# Patient Record
Sex: Female | Born: 1935 | Race: White | Hispanic: No | Smoking: Never smoker
Health system: Southern US, Community
[De-identification: ages and names within clinical notes are randomized; demographics above are authoritative.]

## PROBLEM LIST (undated history)

## (undated) DIAGNOSIS — C4491 Basal cell carcinoma of skin, unspecified: Secondary | ICD-10-CM

## (undated) DIAGNOSIS — G629 Polyneuropathy, unspecified: Secondary | ICD-10-CM

## (undated) DIAGNOSIS — M159 Polyosteoarthritis, unspecified: Secondary | ICD-10-CM

## (undated) DIAGNOSIS — L570 Actinic keratosis: Secondary | ICD-10-CM

## (undated) DIAGNOSIS — C4492 Squamous cell carcinoma of skin, unspecified: Secondary | ICD-10-CM

## (undated) DIAGNOSIS — E785 Hyperlipidemia, unspecified: Secondary | ICD-10-CM

## (undated) HISTORY — DX: Basal cell carcinoma of skin, unspecified: C44.91

## (undated) HISTORY — DX: Polyneuropathy, unspecified: G62.9

## (undated) HISTORY — DX: Squamous cell carcinoma of skin, unspecified: C44.92

## (undated) HISTORY — DX: Hyperlipidemia, unspecified: E78.5

## (undated) HISTORY — PX: EXCISION / CURETTAGE BONE CYST PHALANGES OF FOOT: SUR479

## (undated) HISTORY — PX: MEDIAL PARTIAL KNEE REPLACEMENT: SHX5965

## (undated) HISTORY — DX: Actinic keratosis: L57.0

## (undated) HISTORY — DX: Polyosteoarthritis, unspecified: M15.9

---

## 1985-07-26 HISTORY — PX: LAPAROSCOPIC CHOLECYSTECTOMY: SUR755

## 2020-01-07 ENCOUNTER — Encounter: Payer: Self-pay | Admitting: Dermatology

## 2020-01-07 ENCOUNTER — Other Ambulatory Visit: Payer: Self-pay

## 2020-01-07 ENCOUNTER — Ambulatory Visit (INDEPENDENT_AMBULATORY_CARE_PROVIDER_SITE_OTHER): Payer: Medicare Other | Admitting: Dermatology

## 2020-01-07 DIAGNOSIS — L820 Inflamed seborrheic keratosis: Secondary | ICD-10-CM | POA: Diagnosis not present

## 2020-01-07 DIAGNOSIS — D229 Melanocytic nevi, unspecified: Secondary | ICD-10-CM | POA: Diagnosis not present

## 2020-01-07 DIAGNOSIS — Z86007 Personal history of in-situ neoplasm of skin: Secondary | ICD-10-CM

## 2020-01-07 DIAGNOSIS — L570 Actinic keratosis: Secondary | ICD-10-CM | POA: Diagnosis not present

## 2020-01-07 DIAGNOSIS — D1801 Hemangioma of skin and subcutaneous tissue: Secondary | ICD-10-CM | POA: Diagnosis not present

## 2020-01-07 DIAGNOSIS — Z1283 Encounter for screening for malignant neoplasm of skin: Secondary | ICD-10-CM

## 2020-01-07 DIAGNOSIS — L821 Other seborrheic keratosis: Secondary | ICD-10-CM

## 2020-01-07 DIAGNOSIS — Z85828 Personal history of other malignant neoplasm of skin: Secondary | ICD-10-CM

## 2020-01-07 DIAGNOSIS — L578 Other skin changes due to chronic exposure to nonionizing radiation: Secondary | ICD-10-CM

## 2020-01-07 DIAGNOSIS — I8310 Varicose veins of unspecified lower extremity with inflammation: Secondary | ICD-10-CM

## 2020-01-07 DIAGNOSIS — L814 Other melanin hyperpigmentation: Secondary | ICD-10-CM

## 2020-01-07 NOTE — Progress Notes (Signed)
Follow-Up Visit   Subjective  Lori Ferguson is a 86 y.o. female who presents for the following: Annual Exam (Hx of SCC and BCC ). The patient presents for Total-Body Skin Exam (TBSE) for skin cancer screening and mole check.  The following portions of the chart were reviewed this encounter and updated as appropriate:  Allergies  Meds  Problems  Med Hx  Surg Hx  Fam Hx     Review of Systems:  No other skin or systemic complaints except as noted in HPI or Assessment and Plan.  Objective  Well appearing patient in no apparent distress; mood and affect are within normal limits.  A full examination was performed including scalp, head, eyes, ears, nose, lips, neck, chest, axillae, abdomen, back, buttocks, bilateral upper extremities, bilateral lower extremities, hands, feet, fingers, toes, fingernails, and toenails. All findings within normal limits unless otherwise noted below.  Objective  B/L arms and legs: Erythematous keratotic or waxy stuck-on papule or plaque.   Objective  R ant nasal ala inf to nasal alar groove: Erythematous thin papules/macules with gritty scale.   Assessment & Plan    Inflamed seborrheic keratosis B/L arms and legs  Vs Disseminated Superficial Actinic Porokeratosis (DSAP) - Benign, observe.    AK (actinic keratosis) R ant nasal ala inf to nasal alar groove  Destruction of lesion - R ant nasal ala inf to nasal alar groove Complexity: simple   Destruction method: cryotherapy   Informed consent: discussed and consent obtained   Timeout:  patient name, date of birth, surgical site, and procedure verified Lesion destroyed using liquid nitrogen: Yes   Region frozen until ice ball extended beyond lesion: Yes   Outcome: patient tolerated procedure well with no complications   Post-procedure details: wound care instructions given    Skin cancer screening   Lentigines - Scattered tan macules - Discussed due to sun exposure - Benign, observe -  Call for any changes  Seborrheic Keratoses - Stuck-on, waxy, tan-brown papules and plaques  - Discussed benign etiology and prognosis. - Observe - Call for any changes  Melanocytic Nevi - Tan-brown and/or pink-flesh-colored symmetric macules and papules - Benign appearing on exam today - Observation - Call clinic for new or changing moles - Recommend daily use of broad spectrum spf 30+ sunscreen to sun-exposed areas.   Hemangiomas - Red papules - Discussed benign nature - Observe - Call for any changes  Actinic Damage - diffuse scaly erythematous macules with underlying dyspigmentation - Recommend daily broad spectrum sunscreen SPF 30+ to sun-exposed areas, reapply every 2 hours as needed.  - Call for new or changing lesions.  History of Basal Cell Carcinoma of the Skin - No evidence of recurrence today - Recommend regular full body skin exams - Recommend daily broad spectrum sunscreen SPF 30+ to sun-exposed areas, reapply every 2 hours as needed.  - Call if any new or changing lesions are noted between office visits  History of Squamous Cell Carcinoma in Situ of the Skin - No evidence of recurrence today - Recommend regular full body skin exams - Recommend daily broad spectrum sunscreen SPF 30+ to sun-exposed areas, reapply every 2 hours as needed.  - Call if any new or changing lesions are noted between office visits  Varicose Veins and Spider Veins - Dilated blue, purple or red veins at the lower extremities - Reassured - These can be treated by sclerotherapy (a procedure to inject a medicine into the veins to make them disappear) if desired, but  the treatment is not covered by insurance   Skin cancer screening performed today.   Return in about 1 year (around 01/06/2021) for TBSE; 3 months for AK follow up .  Luther Redo, CMA, am acting as scribe for Sarina Ser, MD .  Documentation: I have reviewed the above documentation for accuracy and completeness,  and I agree with the above.  Sarina Ser, MD

## 2020-01-09 ENCOUNTER — Encounter: Payer: Self-pay | Admitting: Dermatology

## 2020-03-24 ENCOUNTER — Emergency Department: Payer: Medicare Other

## 2020-03-24 ENCOUNTER — Encounter: Payer: Self-pay | Admitting: Family Medicine

## 2020-03-24 ENCOUNTER — Encounter: Payer: Self-pay | Admitting: Emergency Medicine

## 2020-03-24 ENCOUNTER — Other Ambulatory Visit: Payer: Self-pay

## 2020-03-24 ENCOUNTER — Emergency Department
Admission: EM | Admit: 2020-03-24 | Discharge: 2020-03-24 | Disposition: A | Discharge status: Home / Self Care | Payer: Medicare Other | Attending: Emergency Medicine | Admitting: Emergency Medicine

## 2020-03-24 ENCOUNTER — Ambulatory Visit
Admission: EM | Admit: 2020-03-24 | Discharge: 2020-03-24 | Disposition: A | Discharge status: Home / Self Care | Payer: Medicare Other

## 2020-03-24 DIAGNOSIS — Y999 Unspecified external cause status: Secondary | ICD-10-CM | POA: Diagnosis not present

## 2020-03-24 DIAGNOSIS — R55 Syncope and collapse: Secondary | ICD-10-CM | POA: Diagnosis not present

## 2020-03-24 DIAGNOSIS — Z85828 Personal history of other malignant neoplasm of skin: Secondary | ICD-10-CM | POA: Insufficient documentation

## 2020-03-24 DIAGNOSIS — T7840XA Allergy, unspecified, initial encounter: Secondary | ICD-10-CM | POA: Diagnosis present

## 2020-03-24 DIAGNOSIS — X58XXXA Exposure to other specified factors, initial encounter: Secondary | ICD-10-CM | POA: Insufficient documentation

## 2020-03-24 DIAGNOSIS — I959 Hypotension, unspecified: Secondary | ICD-10-CM

## 2020-03-24 DIAGNOSIS — R0682 Tachypnea, not elsewhere classified: Secondary | ICD-10-CM

## 2020-03-24 DIAGNOSIS — R42 Dizziness and giddiness: Secondary | ICD-10-CM

## 2020-03-24 DIAGNOSIS — Y929 Unspecified place or not applicable: Secondary | ICD-10-CM | POA: Insufficient documentation

## 2020-03-24 DIAGNOSIS — L509 Urticaria, unspecified: Secondary | ICD-10-CM

## 2020-03-24 DIAGNOSIS — L500 Allergic urticaria: Secondary | ICD-10-CM | POA: Diagnosis not present

## 2020-03-24 DIAGNOSIS — Y939 Activity, unspecified: Secondary | ICD-10-CM | POA: Diagnosis not present

## 2020-03-24 LAB — CBC
HCT: 51.3 % — ABNORMAL HIGH (ref 36.0–46.0)
Hemoglobin: 17 g/dL — ABNORMAL HIGH (ref 12.0–15.0)
MCH: 31.4 pg (ref 26.0–34.0)
MCHC: 33.1 g/dL (ref 30.0–36.0)
MCV: 94.8 fL (ref 80.0–100.0)
Platelets: 302 10*3/uL (ref 150–400)
RBC: 5.41 MIL/uL — ABNORMAL HIGH (ref 3.87–5.11)
RDW: 13 % (ref 11.5–15.5)
WBC: 12.2 10*3/uL — ABNORMAL HIGH (ref 4.0–10.5)
nRBC: 0 % (ref 0.0–0.2)

## 2020-03-24 LAB — COMPREHENSIVE METABOLIC PANEL
ALT: 19 U/L (ref 0–44)
AST: 29 U/L (ref 15–41)
Albumin: 3.8 g/dL (ref 3.5–5.0)
Alkaline Phosphatase: 50 U/L (ref 38–126)
Anion gap: 10 (ref 5–15)
BUN: 22 mg/dL (ref 8–23)
CO2: 28 mmol/L (ref 22–32)
Calcium: 8.7 mg/dL — ABNORMAL LOW (ref 8.9–10.3)
Chloride: 101 mmol/L (ref 98–111)
Creatinine, Ser: 0.73 mg/dL (ref 0.44–1.00)
GFR calc Af Amer: >60 mL/min (ref >60)
GFR calc non Af Amer: >60 mL/min (ref >60)
Glucose, Bld: 136 mg/dL — ABNORMAL HIGH (ref 70–99)
Potassium: 4 mmol/L (ref 3.5–5.1)
Sodium: 139 mmol/L (ref 135–145)
Total Bilirubin: 1.1 mg/dL (ref 0.3–1.2)
Total Protein: 7.1 g/dL (ref 6.5–8.1)

## 2020-03-24 LAB — URINALYSIS, COMPLETE (UACMP) WITH MICROSCOPIC
Bacteria, UA: NONE SEEN
Bilirubin Urine: NEGATIVE
Glucose, UA: NEGATIVE mg/dL
Hgb urine dipstick: NEGATIVE
Ketones, ur: NEGATIVE mg/dL
Leukocytes,Ua: NEGATIVE
Nitrite: NEGATIVE
Protein, ur: 30 mg/dL — AB
Specific Gravity, Urine: 1.027 (ref 1.005–1.030)
pH: 5 (ref 5.0–8.0)

## 2020-03-24 LAB — TROPONIN I (HIGH SENSITIVITY): Troponin I (High Sensitivity): 6 ng/L (ref <18)

## 2020-03-24 MED ORDER — FAMOTIDINE IN NACL 20-0.9 MG/50ML-% IV SOLN
20.0000 mg | Freq: Once | INTRAVENOUS | Status: AC
  Administered 2020-03-24: 20 mg via INTRAVENOUS
  Filled 2020-03-24: qty 50

## 2020-03-24 MED ORDER — EPINEPHRINE 0.3 MG/0.3ML IJ SOAJ: 0.3000 mg | INTRAMUSCULAR | 0 refills | Status: DC | PRN

## 2020-03-24 MED ORDER — DIPHENHYDRAMINE HCL 50 MG/ML IJ SOLN
25.0000 mg | Freq: Once | INTRAMUSCULAR | Status: AC
  Administered 2020-03-24: 25 mg via INTRAVENOUS
  Filled 2020-03-24: qty 1

## 2020-03-24 MED ORDER — DIPHENHYDRAMINE HCL 50 MG/ML IJ SOLN
50.0000 mg | Freq: Once | INTRAMUSCULAR | Status: AC
  Administered 2020-03-24: 50 mg via INTRAVENOUS
  Filled 2020-03-24: qty 1

## 2020-03-24 MED ORDER — PREDNISONE 20 MG PO TABS: 60.0000 mg | ORAL_TABLET | Freq: Every day | ORAL | 0 refills | Status: AC

## 2020-03-24 MED ORDER — METHYLPREDNISOLONE SODIUM SUCC 125 MG IJ SOLR
125.0000 mg | Freq: Once | INTRAMUSCULAR | Status: AC
  Administered 2020-03-24: 125 mg via INTRAVENOUS
  Filled 2020-03-24: qty 2

## 2020-03-24 NOTE — ED Notes (Signed)
Pt presents with rash since Saturday evening, Beginning around her abdomen and spreading to her arms, chest, and back. Pt reports no rash to legs. Pt states rash has still been spreading, moving up to her chin since she arrived in the ED. Pt denies changes to work of breathing. Pt not on o2 at home  Pt reports one syncopal episode this morning at approx 0700, from which she "fell on her back," denies pain to head, neck, arms, hips, knees, etc.   Pt began new course of antibiotics on Friday (x2). Denies n/v/d

## 2020-03-24 NOTE — ED Triage Notes (Signed)
Pt here for syncope this AM.  Also here for rash to BUE and torso.  LUE is swollen more so than right and appears one large hive from forearm to axilla; warm to touch.  Some redness starting near right axilla. Itching in nature.  No dyspnea.  Was at urgent care and sent here for further eval.  Rash started Saturday after finished course abx.

## 2020-03-24 NOTE — ED Notes (Signed)
Provided pt with meal tray and water per request. Denies further needs

## 2020-03-24 NOTE — Discharge Instructions (Addendum)
Take the prednisone as prescribed starting tomorrow and finish the full 4-day course.  You may also take Benadryl as needed for itching.  You should use the EpiPen for severely worsening hives or swelling, shortness of breath, discomfort or tightness in your throat, any swelling to your lips or face, or any other symptoms of severe allergic reaction.  Return to the ER immediately for new, worsening, or persistent severe hives, swelling, rash, severe allergic symptoms as above, recurrent lightheadedness or feel like you are going to pass out, or any other new or worsening symptoms that concern you.  Follow-up with your primary care doctor within the next week.

## 2020-03-24 NOTE — ED Provider Notes (Signed)
[facility] Emergency Department Provider Note ____________________________________________   First MD Initiated Contact with Patient 03/24/20 1708     (approximate)  I have reviewed the triage vital signs and the nursing notes.   HISTORY  Chief Complaint Allergic Reaction and Loss of Consciousness    HPI Lori Ferguson is a 86 y.o. female with PMH as noted below who presents primarily for hives and rash to bilateral upper extremities and torso over the last 2 days. She states that the rash starts as small hives which then congeal into larger areas of raised redness. The rash is itchy and not painful. She denies associated shortness of breath, tightness or discomfort in her throat, lip or facial swelling. The patient states that initially she had knee pain, was treated by her orthopedist for possible joint infection with a course of an antibiotic that ended 3 days ago. She also received a cortisone injection into her knee at that time, but states that she has received these before without difficulty. She denies any other known allergic exposures.  In addition, the patient reports one episode of syncope and 1 of near syncope today. She states that her home today shortly after getting up, she felt lightheaded and then passed out, waking up on the floor. She believes that she did hit her head. Subsequently when she was at urgent care this morning, she felt lightheaded and per the daughter, briefly appeared confused and started trembling. This lasted for a few seconds and then the patient immediately came to.  Past Medical History:  Diagnosis Date  . Actinic keratosis   . Basal cell carcinoma    nasal tip   . Squamous cell carcinoma of skin    L tricep, R index finger    There are no problems to display for this patient.   History reviewed. No pertinent surgical history.  Prior to Admission medications   Medication Sig Start Date End Date Taking?  Authorizing Provider  EPINEPHrine (EPIPEN 2-PAK) 0.3 mg/0.3 mL IJ SOAJ injection Inject 0.3 mLs (0.3 mg total) into the muscle as needed for anaphylaxis. 03/24/20   Arta Silence, MD  predniSONE (DELTASONE) 20 MG tablet Take 3 tablets (60 mg total) by mouth daily with breakfast for 4 days. 03/24/20 03/28/20  Arta Silence, MD    Allergies Celebrex [celecoxib], Tramadol, and Vesicare [solifenacin]  History reviewed. No pertinent family history.  Social History Social History   Tobacco Use  . Smoking status: Never Smoker  . Smokeless tobacco: Never Used  Substance Use Topics  . Alcohol use: Never  . Drug use: Never    Review of Systems  Constitutional: No fever/chills. Eyes: No redness. ENT: No throat discomfort. Cardiovascular: Denies chest pain. Respiratory: Denies shortness of breath. Gastrointestinal: No vomiting or diarrhea.  Genitourinary: Negative for dysuria.  Musculoskeletal: Negative for back pain. Skin: Positive for urticaria. Neurological: Negative for headaches, focal weakness or numbness.   ____________________________________________   PHYSICAL EXAM:  VITAL SIGNS: ED Triage Vitals  Enc Vitals Group     BP 03/24/20 1047 (!) 103/56     Pulse Rate 03/24/20 1047 79     Resp 03/24/20 1047 18     Temp 03/24/20 1047 97.9 F (36.6 C)     Temp Source 03/24/20 1047 Oral     SpO2 03/24/20 1047 (!) 89 %     Weight 03/24/20 1110 170 lb (77.1 kg)     Height 03/24/20 1110 5' (1.524 m)     Head Circumference --  Peak Flow --      Pain Score 03/24/20 1110 0     Pain Loc --      Pain Edu? --      Excl. in [place]? --     Constitutional: Alert and oriented. Well appearing and in no acute distress. Eyes: Conjunctivae are normal. EOMI. PERRLA. Head: Atraumatic. Nose: No congestion/rhinnorhea. Mouth/Throat: Mucous membranes are moist. Oropharynx clear with no erythema or swelling. No stridor. Neck: Normal range of motion. No midline cervical spinal  tenderness. Cardiovascular: Normal rate, regular rhythm. Grossly normal heart sounds.  Good peripheral circulation. Respiratory: Normal respiratory effort.  No retractions. Lungs CTAB. Gastrointestinal: Soft and nontender. No distention.  Genitourinary: No flank tenderness. Musculoskeletal: No lower extremity edema.  Extremities warm and well perfused.  Neurologic:  Normal speech and language. No gross focal neurologic deficits are appreciated.  Skin:  Skin is warm and dry. Scattered urticaria over the back of the neck, torso, back, and upper extremities bilaterally, with some areas of more contiguous raised erythematous urticarial type rash especially in the proximal left upper extremity. These areas are blanching and nontender. There is no induration or abnormal warmth. Psychiatric: Mood and affect are normal. Speech and behavior are normal.  ____________________________________________   LABS (all labs ordered are listed, but only abnormal results are displayed)  Labs Reviewed  CBC - Abnormal; Notable for the following components:      Result Value   WBC 12.2 (*)    RBC 5.41 (*)    Hemoglobin 17.0 (*)    HCT 51.3 (*)    All other components within normal limits  URINALYSIS, COMPLETE (UACMP) WITH MICROSCOPIC - Abnormal; Notable for the following components:   Color, Urine YELLOW (*)    APPearance CLOUDY (*)    Protein, ur 30 (*)    All other components within normal limits  COMPREHENSIVE METABOLIC PANEL - Abnormal; Notable for the following components:   Glucose, Bld 136 (*)    Calcium 8.7 (*)    All other components within normal limits  CBG MONITORING, ED  TROPONIN I (HIGH SENSITIVITY)  TROPONIN I (HIGH SENSITIVITY)   ____________________________________________  EKG  ED ECG REPORT I, Arta Silence, the attending physician, personally viewed and interpreted this ECG.  Date: 03/24/2020 EKG Time: 1109 Rate: 90 Rhythm: normal sinus rhythm QRS Axis:  normal Intervals: normal ST/T Wave abnormalities: normal Narrative Interpretation: no evidence of acute ischemia  ____________________________________________  RADIOLOGY  CT head: No ICH or other acute abnormality US venous LUE: No acute DVT  ____________________________________________   PROCEDURES  Procedure(s) performed: No  Procedures  Critical Care performed: *No ____________________________________________   INITIAL IMPRESSION / ASSESSMENT AND PLAN / ED COURSE  Pertinent labs & imaging results that were available during my care of the patient were reviewed by me and considered in my medical decision making (see chart for details).  86 year old female with PMH as noted above presents with an urticarial type rash to bilateral upper extremities, torso, neck over the last 2 days and occurring after she had just finished a course of an antibiotic for possible infection in her knee. She also received a cortisone shot at that time. She denies any other possible new exposures. She also had a syncopal event earlier today.  I reviewed the past medical records in [place]. The patient was seen at urgent care earlier today and at that time was noted to be hypotensive and had a second near syncopal event. Patient otherwise has no recent prior ED visits  or admissions. I do not see any past records in Care Everywhere and cannot determine what antibiotic she was put on.  On exam, the patient is overall well-appearing. Her vital signs are currently normal. Physical exam is remarkable primarily for urticaria over the upper body and extremities, most notably in the left arm where the urticarial lesions have come together into larger raised areas. However there is no induration or abnormal warmth. There are no oropharyngeal lesions, no swelling, and her lungs are clear.  Overall presentation is most consistent with an allergic reaction. The patient received IV Benadryl and prednisone earlier with  some improvement primarily in the pruritus. She has no oropharyngeal or respiratory symptoms that would suggest anaphylaxis, and has no indication for epinephrine at this time. Given that she has been in the ED for more than 6 hours I will give a second dose of Benadryl as well as Pepcid. There is no evidence of cellulitis. DVT study of the left upper extremity was obtained from triage and is negative. If the patient has no further worsening in her symptoms, she is stable for discharge with p.o. prednisone and Benadryl at home. If this is related to the antibiotic use, the medication has already been discontinued.  In terms of the syncope, presentation is overall most consistent with a vasovagal episode or orthostatic hypotension given that it occurred right after she got up. The episode while she was at urgent care is consistent with a near syncopal episode rather than seizure or stroke.  Initial lab work-up is unremarkable. EKG shows no acute findings. I have added on a troponin as well as a CT head.  ----------------------------------------- 8:23 PM on 03/24/2020 -----------------------------------------  CT head and troponin are both negative. The patient has had no further worsening of her rash and continues to feel well. I counseled her on the results of the work-up. I offered observation admission due to the syncope, however overall it is most consistent with a benign etiology. The patient states that she feels comfortable and would like to go home. I feel that this is reasonable given that she has already been observed in the ED for 10 hours.  I will prescribe prednisone as well as an EpiPen and I instructed her on its use. I gave thorough return precautions to the patient and her daughter, and they expressed understanding.  ____________________________________________   FINAL CLINICAL IMPRESSION(S) / ED DIAGNOSES  Final diagnoses:  Allergic reaction, initial encounter  Urticaria   Syncope, unspecified syncope type      NEW MEDICATIONS STARTED DURING THIS VISIT:  Discharge Medication List as of 03/24/2020  7:54 PM    START taking these medications   Details  EPINEPHrine (EPIPEN 2-PAK) 0.3 mg/0.3 mL IJ SOAJ injection Inject 0.3 mLs (0.3 mg total) into the muscle as needed for anaphylaxis., Starting Mon 03/24/2020, Normal    predniSONE (DELTASONE) 20 MG tablet Take 3 tablets (60 mg total) by mouth daily with breakfast for 4 days., Starting Mon 03/24/2020, Until Fri 03/28/2020, Normal         Note:  This document was prepared using Dragon voice recognition software and may include unintentional dictation errors.   Arta Silence, MD 03/24/20 2028

## 2020-03-24 NOTE — ED Triage Notes (Signed)
Patient c/o rash, dizziness, and nausea. During triage, patient began shaking and non-responsive for about 7-8 seconds after which patent answered questions appropriately. Provider called to bedside. Patient A&Ox3. EMS called.  Patient is being discharged from the Urgent Care and sent to the Emergency Department via EMS . Per Loura Halt, NP, patient is in need of higher level of care due to hypotension, tachypnea. Patient is aware and verbalizes understanding of plan of care.  Vitals:   03/24/20 0912  BP: (!) 70/45  Pulse: 87  Resp: (!) 24  SpO2: 98%

## 2020-03-24 NOTE — ED Notes (Signed)
Signature pad unresponsive

## 2020-03-24 NOTE — ED Provider Notes (Signed)
Roderic Palau    CSN: [id number] Arrival date & time: 03/24/20  7510      History   Chief Complaint Chief Complaint  Patient presents with  . Rash  . Nausea  . Dizziness    HPI Lori Ferguson is a 86 y.o. female.   Otherwise healthy 86 year old female presents today for possible allergic reaction to antibiotics.  She has had rash, dizziness, nausea, weakness, shortness of breath since starting the antibiotic worsening.  Patient feeling diaphoretic upon arrival and tachypneic.  Most likely vagal response.  Currently alert.  Based on symptoms and patient's hypotension and tachypnea will send to the ER by EMS.     Past Medical History:  Diagnosis Date  . Actinic keratosis   . Basal cell carcinoma    nasal tip   . Squamous cell carcinoma of skin    L tricep, R index finger    There are no problems to display for this patient.   History reviewed. No pertinent surgical history.  OB History   No obstetric history on file.      Home Medications    Prior to Admission medications   Not on File    Family History History reviewed. No pertinent family history.  Social History Social History   Tobacco Use  . Smoking status: Not on file  Substance Use Topics  . Alcohol use: Not on file  . Drug use: Not on file     Allergies   Celebrex [celecoxib], Tramadol, and Vesicare [solifenacin]   Review of Systems Review of Systems   Physical Exam Triage Vital Signs ED Triage Vitals [03/24/20 0912]  Enc Vitals Group     BP (!) 70/45     Pulse Rate 87     Resp (!) 24     Temp      Temp src      SpO2 98 %     Weight      Height      Head Circumference      Peak Flow      Pain Score      Pain Loc      Pain Edu?      Excl. in [place]?    No data found.  Updated Vital Signs BP (!) 70/45   Pulse 87   Resp (!) 24   SpO2 98%   Visual Acuity Right Eye Distance:   Left Eye Distance:   Bilateral Distance:    Right Eye Near:   Left Eye Near:     Bilateral Near:     Physical Exam Vitals and nursing note reviewed.  Constitutional:      General: She is in acute distress.     Appearance: Normal appearance. She is ill-appearing. She is not toxic-appearing or diaphoretic.  HENT:     Head: Normocephalic.     Nose: Nose normal.     Mouth/Throat:     Pharynx: Oropharynx is clear.  Eyes:     Conjunctiva/sclera: Conjunctivae normal.  Cardiovascular:     Rate and Rhythm: Normal rate and regular rhythm.     Pulses: Normal pulses.     Heart sounds: Normal heart sounds.  Pulmonary:     Effort: Pulmonary effort is normal.     Comments: Tachypnea  Musculoskeletal:        General: Normal range of motion.     Cervical back: Normal range of motion.  Skin:    General: Skin is warm and dry.  Findings: Rash present.  Neurological:     Mental Status: She is alert.  Psychiatric:        Mood and Affect: Mood normal.      UC Treatments / Results  Labs (all labs ordered are listed, but only abnormal results are displayed) Labs Reviewed - No data to display  EKG   Radiology No results found.  Procedures Procedures (including critical care time)  Medications Ordered in UC Medications - No data to display  Initial Impression / Assessment and Plan / UC Course  I have reviewed the triage vital signs and the nursing notes.  Pertinent labs & imaging results that were available during my care of the patient were reviewed by me and considered in my medical decision making (see chart for details).     Allergic reaction Patient with concerning symptoms today and hypertension Had episode of near loss of consciousness here today. Sending to the ER for further evaluation Final Clinical Impressions(s) / UC Diagnoses   Final diagnoses:  Allergic reaction, initial encounter  Hypotension, unspecified hypotension type  Dizziness  Tachypnea     Discharge Instructions     ER by EMS     ED Prescriptions    None      PDMP not reviewed this encounter.   Orvan July, NP 03/24/20 [id number]

## 2020-03-24 NOTE — Discharge Instructions (Addendum)
ER by EMS

## 2020-04-17 ENCOUNTER — Encounter: Payer: Self-pay | Admitting: Dermatology

## 2020-04-17 ENCOUNTER — Ambulatory Visit (INDEPENDENT_AMBULATORY_CARE_PROVIDER_SITE_OTHER): Payer: Medicare Other | Admitting: Dermatology

## 2020-04-17 ENCOUNTER — Other Ambulatory Visit: Payer: Self-pay

## 2020-04-17 DIAGNOSIS — Z872 Personal history of diseases of the skin and subcutaneous tissue: Secondary | ICD-10-CM | POA: Diagnosis not present

## 2020-04-17 DIAGNOSIS — L700 Acne vulgaris: Secondary | ICD-10-CM | POA: Diagnosis not present

## 2020-04-17 DIAGNOSIS — L578 Other skin changes due to chronic exposure to nonionizing radiation: Secondary | ICD-10-CM | POA: Diagnosis not present

## 2020-04-17 DIAGNOSIS — Z85828 Personal history of other malignant neoplasm of skin: Secondary | ICD-10-CM | POA: Diagnosis not present

## 2020-04-17 NOTE — Progress Notes (Signed)
   Follow-Up Visit   Subjective  Lori Ferguson is a 86 y.o. female who presents for the following: Actinic Keratosis (R ant nasal ala inf to nasal alar groove, 23m f/u, LN2) and Other (Recent allergic reaction to Sulfa medication).  The following portions of the chart were reviewed this encounter and updated as appropriate:  Tobacco  Allergies  Meds  Problems  Med Hx  Surg Hx  Fam Hx     Review of Systems:  No other skin or systemic complaints except as noted in HPI or Assessment and Plan.  Objective  Well appearing patient in no apparent distress; mood and affect are within normal limits.  A focused examination was performed including face. Relevant physical exam findings are noted in the Assessment and Plan.  Objective  Mid Tip of Nose: Open comedone   Objective  Left Tip of Nose: Clear today.   Assessment & Plan    Actinic Damage - diffuse scaly erythematous macules with underlying dyspigmentation - Recommend daily broad spectrum sunscreen SPF 30+ to sun-exposed areas, reapply every 2 hours as needed.  - Call for new or changing lesions.  History of Basal Cell Carcinoma of the Skin - No evidence of recurrence today - Recommend regular full body skin exams - Recommend daily broad spectrum sunscreen SPF 30+ to sun-exposed areas, reapply every 2 hours as needed.  - Call if any new or changing lesions are noted between office visits  History of Squamous Cell Carcinoma of the Skin - No evidence of recurrence today - No lymphadenopathy - Recommend regular full body skin exams - Recommend daily broad spectrum sunscreen SPF 30+ to sun-exposed areas, reapply every 2 hours as needed.  - Call if any new or changing lesions are noted between office visits  Acne vulgaris - open comedone Mid Tip of Nose [place] qd - samples given.  History of actinic keratoses Left Tip of Nose Clear. Observe for recurrence. Call clinic for new or changing lesions.  Recommend regular  skin exams, daily broad-spectrum spf 30+ sunscreen use, and photoprotection.    Return for TBSE as scheduled.  I, Ashok Cordia, CMA, am acting as scribe for Sarina Ser, MD .  Documentation: I have reviewed the above documentation for accuracy and completeness, and I agree with the above.  Sarina Ser, MD

## 2020-09-22 DIAGNOSIS — G609 Hereditary and idiopathic neuropathy, unspecified: Secondary | ICD-10-CM | POA: Diagnosis not present

## 2020-09-22 DIAGNOSIS — E785 Hyperlipidemia, unspecified: Secondary | ICD-10-CM | POA: Diagnosis not present

## 2020-09-22 DIAGNOSIS — M1711 Unilateral primary osteoarthritis, right knee: Secondary | ICD-10-CM | POA: Diagnosis not present

## 2020-09-24 DIAGNOSIS — R2241 Localized swelling, mass and lump, right lower limb: Secondary | ICD-10-CM | POA: Diagnosis not present

## 2020-09-24 DIAGNOSIS — Z96651 Presence of right artificial knee joint: Secondary | ICD-10-CM | POA: Diagnosis not present

## 2020-11-14 ENCOUNTER — Ambulatory Visit (INDEPENDENT_AMBULATORY_CARE_PROVIDER_SITE_OTHER): Payer: Medicare Other | Admitting: Internal Medicine

## 2020-11-14 ENCOUNTER — Other Ambulatory Visit: Payer: Self-pay

## 2020-11-14 ENCOUNTER — Encounter: Payer: Self-pay | Admitting: Internal Medicine

## 2020-11-14 DIAGNOSIS — I779 Disorder of arteries and arterioles, unspecified: Secondary | ICD-10-CM | POA: Insufficient documentation

## 2020-11-14 DIAGNOSIS — E785 Hyperlipidemia, unspecified: Secondary | ICD-10-CM

## 2020-11-14 DIAGNOSIS — M8949 Other hypertrophic osteoarthropathy, multiple sites: Secondary | ICD-10-CM

## 2020-11-14 DIAGNOSIS — I6529 Occlusion and stenosis of unspecified carotid artery: Secondary | ICD-10-CM

## 2020-11-14 DIAGNOSIS — G629 Polyneuropathy, unspecified: Secondary | ICD-10-CM

## 2020-11-14 DIAGNOSIS — M159 Polyosteoarthritis, unspecified: Secondary | ICD-10-CM | POA: Insufficient documentation

## 2020-11-14 NOTE — Assessment & Plan Note (Signed)
Doing well on the gabapentin 

## 2020-11-14 NOTE — Progress Notes (Signed)
Subjective:    Patient ID: Lori Ferguson, female    DOB: 1933/05/11, 87 y.o.   MRN: [id number]  HPI Here to establish care I saw her at [facility] after partial knee replacement This visit occurred during the SARS-CoV-2 public health emergency.  Safety protocols were in place, including screening questions prior to the visit, additional usage of staff PPE, and extensive cleaning of exam room while observing appropriate contact time as indicated for disinfecting solutions.   Has done pretty well after partial knee replacement Has been on antibiotics for infection post op Has to go back to Dr Harlow Mares next week again  [facility]  Only problem is neuropathy Continues on the gabapentin  Lives in [place] behind daughter's house-- since 2019 Independent with ADLs and housekeeping Back to driving for the past few weeks also  Some history of carotid stenosis  Current Outpatient Medications on File Prior to Visit  Medication Sig Dispense Refill  . gabapentin (NEURONTIN) 100 MG capsule Take 1 capsule by mouth daily.    Marland Kitchen gabapentin (NEURONTIN) 300 MG capsule Take 1 capsule by mouth at bedtime.     No current facility-administered medications on file prior to visit.    Allergies  Allergen Reactions  . Celebrex [Celecoxib]   . Cortisone   . Keflex [Cephalexin]   . Sulfa Antibiotics   . Tramadol   . Vesicare [Solifenacin]     Past Medical History:  Diagnosis Date  . Actinic keratosis   . Basal cell carcinoma    nasal tip   . Hyperlipidemia   . Osteoarthritis of multiple joints    Mostly knees--some in hands  . Peripheral neuropathy    sees Dr Melrose Nakayama  . Squamous cell carcinoma of skin    L tricep, R index finger    Past Surgical History:  Procedure Laterality Date  . EXCISION / CURETTAGE BONE CYST PHALANGES OF FOOT    . LAPAROSCOPIC CHOLECYSTECTOMY  1987  . MEDIAL PARTIAL KNEE REPLACEMENT Right     Family History  Problem Relation Age of Onset   . Breast cancer Sister   . Mesothelioma Sister   . Breast cancer Sister        2 sisters    Social History   Socioeconomic History  . Marital status: Widowed    Spouse name: Not on file  . Number of children: 3  . Years of education: Not on file  . Highest education level: Not on file  Occupational History  . 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 Smoking status: Never Smoker  . Smokeless tobacco: Never Used  Substance and Sexual Activity  . Alcohol use: Never  . Drug use: Never  . Sexual activity: Not on file  Other Topics Concern  . Not on file  Social History Narrative   2 sons, 1 daughter      No living will   Daughter Kenyon Ana is health care POA.    Would accept resuscitation   No tube feeds if cognitively unaware   Social Determinants of Health   Financial Resource Strain: Not on file  Food Insecurity: Not on file  Transportation Needs: Not on file  Physical Activity: Not on file  Stress: Not on file  Social Connections: Not on file  Intimate Partner Violence: Not on file   Review of Systems No fever No N/V Appetite is fine Weight is stable  Objective:   Physical Exam Constitutional:      Appearance: Normal appearance.  Cardiovascular:     Rate and Rhythm: Normal rate and regular rhythm.     Heart sounds: No murmur heard. No gallop.   Pulmonary:     Effort: Pulmonary effort is normal.     Breath sounds: Normal breath sounds. No wheezing or rales.  Musculoskeletal:     Cervical back: Neck supple.     Right lower leg: No edema.     Left lower leg: No edema.     Comments: No redness, discharge or swelling at right knee site  Lymphadenopathy:     Cervical: No cervical adenopathy.  Neurological:     Mental Status: She is alert.  Psychiatric:        Mood and Affect: Mood normal.        Behavior: Behavior normal.            Assessment & Plan:

## 2020-11-14 NOTE — Assessment & Plan Note (Signed)
Has mostly recovered from partial knee on right Mild infection--hopefully not involving prosthesis Continuing the antibiotic for now

## 2020-11-14 NOTE — Assessment & Plan Note (Signed)
Formerly on statin but off now due to neuropathy

## 2020-11-14 NOTE — Assessment & Plan Note (Signed)
Found on [facility] screening Does take asa daily

## 2021-01-02 ENCOUNTER — Ambulatory Visit (INDEPENDENT_AMBULATORY_CARE_PROVIDER_SITE_OTHER): Payer: Medicare Other | Admitting: Dermatology

## 2021-01-02 ENCOUNTER — Encounter: Payer: Self-pay | Admitting: Dermatology

## 2021-01-02 ENCOUNTER — Other Ambulatory Visit: Payer: Self-pay

## 2021-01-02 DIAGNOSIS — Z1283 Encounter for screening for malignant neoplasm of skin: Secondary | ICD-10-CM

## 2021-01-02 DIAGNOSIS — Z85828 Personal history of other malignant neoplasm of skin: Secondary | ICD-10-CM | POA: Diagnosis not present

## 2021-01-02 DIAGNOSIS — L578 Other skin changes due to chronic exposure to nonionizing radiation: Secondary | ICD-10-CM

## 2021-01-02 DIAGNOSIS — I6529 Occlusion and stenosis of unspecified carotid artery: Secondary | ICD-10-CM | POA: Diagnosis not present

## 2021-01-02 DIAGNOSIS — L814 Other melanin hyperpigmentation: Secondary | ICD-10-CM

## 2021-01-02 DIAGNOSIS — L820 Inflamed seborrheic keratosis: Secondary | ICD-10-CM | POA: Diagnosis not present

## 2021-01-02 DIAGNOSIS — L299 Pruritus, unspecified: Secondary | ICD-10-CM | POA: Diagnosis not present

## 2021-01-02 DIAGNOSIS — L309 Dermatitis, unspecified: Secondary | ICD-10-CM | POA: Diagnosis not present

## 2021-01-02 DIAGNOSIS — D1800 Hemangioma unspecified site: Secondary | ICD-10-CM

## 2021-01-02 DIAGNOSIS — D229 Melanocytic nevi, unspecified: Secondary | ICD-10-CM

## 2021-01-02 DIAGNOSIS — L570 Actinic keratosis: Secondary | ICD-10-CM | POA: Diagnosis not present

## 2021-01-02 DIAGNOSIS — L821 Other seborrheic keratosis: Secondary | ICD-10-CM

## 2021-01-02 NOTE — Patient Instructions (Signed)

## 2021-01-02 NOTE — Progress Notes (Signed)
Follow-Up Visit   Subjective  Lori Ferguson is a 87 y.o. female who presents for the following: Annual Exam (History of BCC, SCC, AK - TBSE today). The patient presents for Total-Body Skin Exam (TBSE) for skin cancer screening and mole check.  The following portions of the chart were reviewed this encounter and updated as appropriate:   Tobacco  Allergies  Meds  Problems  Med Hx  Surg Hx  Fam Hx      Review of Systems:  No other skin or systemic complaints except as noted in HPI or Assessment and Plan.  Objective  Well appearing patient in no apparent distress; mood and affect are within normal limits.  A full examination was performed including scalp, head, eyes, ears, nose, lips, neck, chest, axillae, abdomen, back, buttocks, bilateral upper extremities, bilateral lower extremities, hands, feet, fingers, toes, fingernails, and toenails. All findings within normal limits unless otherwise noted below.  Bilateral arms Erythematous keratotic or waxy stuck-on papule or plaque.   Left Forehead x 1, left lat nose x 1 (2) Erythematous thin papules/macules with gritty scale.   Left cheek x1, right forehead x 1 Stuck on waxy tan papule with erythema   Assessment & Plan   Lentigines - Scattered tan macules - Due to sun exposure - Benign-appering, observe - Recommend daily broad spectrum sunscreen SPF 30+ to sun-exposed areas, reapply every 2 hours as needed. - Call for any changes  Seborrheic Keratoses - Stuck-on, waxy, tan-brown papules and/or plaques  - Benign-appearing - Discussed benign etiology and prognosis. - Observe - Call for any changes  Melanocytic Nevi - Tan-brown and/or pink-flesh-colored symmetric macules and papules - Benign appearing on exam today - Observation - Call clinic for new or changing moles - Recommend daily use of broad spectrum spf 30+ sunscreen to sun-exposed areas.   Hemangiomas - Red papules - Discussed benign nature -  Observe - Call for any changes  Actinic Damage - Chronic condition, secondary to cumulative UV/sun exposure - diffuse scaly erythematous macules with underlying dyspigmentation - Recommend daily broad spectrum sunscreen SPF 30+ to sun-exposed areas, reapply every 2 hours as needed.  - Staying in the shade or wearing long sleeves, sun glasses (UVA+UVB protection) and wide brim hats (4-inch brim around the entire circumference of the hat) are also recommended for sun protection.  - Call for new or changing lesions.  Skin cancer screening performed today.  History of Basal Cell Carcinoma of the Skin - No evidence of recurrence today - Recommend regular full body skin exams - Recommend daily broad spectrum sunscreen SPF 30+ to sun-exposed areas, reapply every 2 hours as needed.  - Call if any new or changing lesions are noted between office visits  History of Squamous Cell Carcinoma of the Skin - No evidence of recurrence today - No lymphadenopathy - Recommend regular full body skin exams - Recommend daily broad spectrum sunscreen SPF 30+ to sun-exposed areas, reapply every 2 hours as needed.  - Call if any new or changing lesions are noted between office visits   Inflamed seborrheic keratosis Bilateral arms Vs Disseminated Superficial Actinic Porokeratosis (DSAP) -neck and persistent.   Chronic and persistent.   Benign-appearing.  Observation.  Call clinic for new or changing lesions.  Recommend daily use of broad spectrum spf 30+ sunscreen to sun-exposed areas.  Pt declines treatment.   May use [employer] Cholesterol/Lovastatin topical in future.  Pruritus Right Ear With some discomfort - Recommend evaluation with ENT. Will refer her to [facility].  Ambulatory referral to ENT - Right Ear  Eczema, unspecified type Chest - Medial (Center) Vs Seborrheic Dermatitis -  Elocon cream- patient has prescription Seborrheic Dermatitis  -  is a chronic persistent rash  characterized by pinkness and scaling most commonly of the mid face but also can occur on the scalp (dandruff), ears; mid chest and mid back. It tends to be exacerbated by stress and cooler weather.  People who have neurologic disease may experience new onset or exacerbation of existing seborrheic dermatitis.  The condition is not curable but treatable and can be controlled.  AK (actinic keratosis) (2) Left Forehead x 1, left lat nose x 1  Destruction of lesion - Left Forehead x 1, left lat nose x 1 Complexity: simple   Destruction method: cryotherapy   Informed consent: discussed and consent obtained   Timeout:  patient name, date of birth, surgical site, and procedure verified Lesion destroyed using liquid nitrogen: Yes   Region frozen until ice ball extended beyond lesion: Yes   Outcome: patient tolerated procedure well with no complications   Post-procedure details: wound care instructions given    Seborrheic keratosis, inflamed Left cheek x1, right forehead x 1  Destruction of lesion - Left cheek x1, right forehead x 1 Complexity: simple   Destruction method: cryotherapy   Informed consent: discussed and consent obtained   Timeout:  patient name, date of birth, surgical site, and procedure verified Lesion destroyed using liquid nitrogen: Yes   Region frozen until ice ball extended beyond lesion: Yes   Outcome: patient tolerated procedure well with no complications   Post-procedure details: wound care instructions given    Return in about 1 year (around 01/02/2022) for TBSE.  I, Ashok Cordia, CMA, am acting as scribe for Sarina Ser, MD .  Documentation: I have reviewed the above documentation for accuracy and completeness, and I agree with the above.  Sarina Ser, MD

## 2021-01-08 ENCOUNTER — Encounter: Payer: Medicare Other | Admitting: Dermatology

## 2021-05-18 ENCOUNTER — Ambulatory Visit: Payer: Medicare Other | Admitting: Internal Medicine

## 2021-08-27 IMAGING — US US EXTREM  UP VENOUS*L*
1 series · 13 of 24 positions shown · non-contrast
Comparison: None.

CLINICAL DATA: Left upper extremity edema.  Evaluate for DVT.



[Series 1: us venous img upper uni left (dvt) · portal-venous · 13 of 28 slices shown]
[im 1/28]
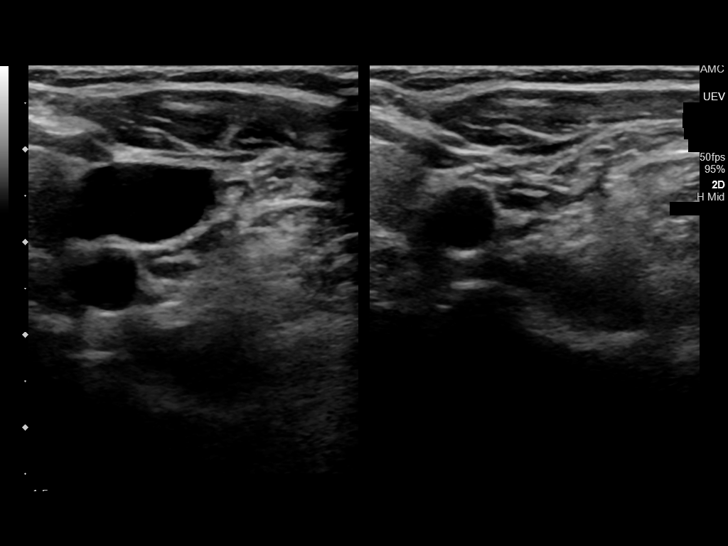
[im 3/28]
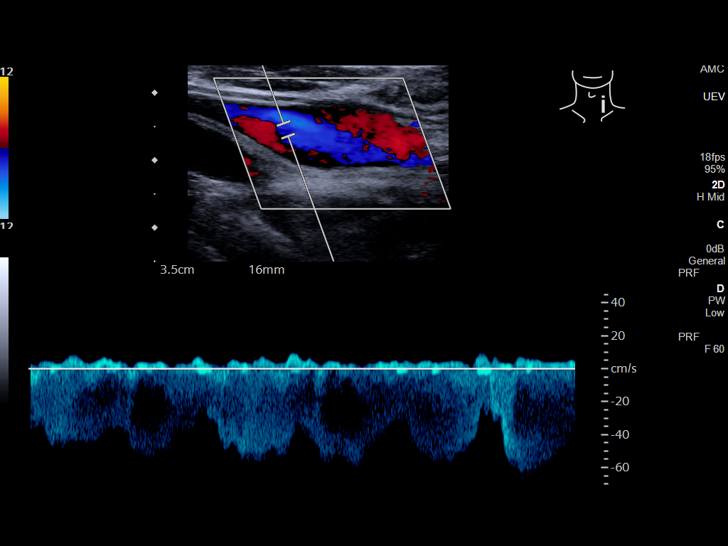
[im 5/28]
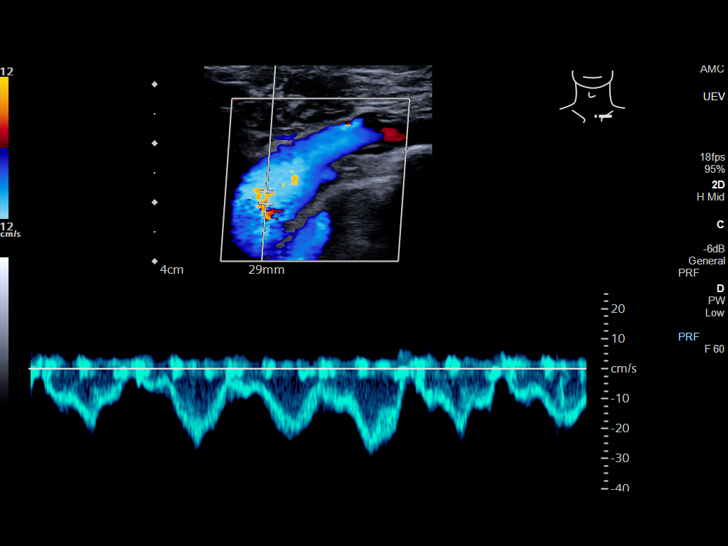
[im 8/28]
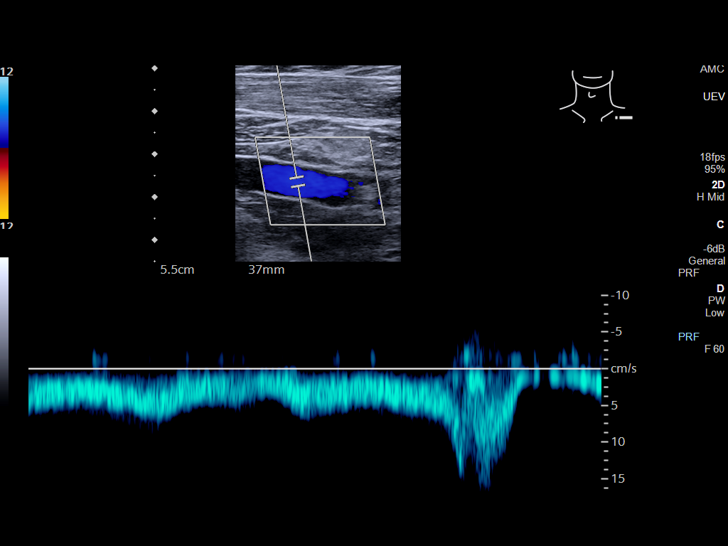
[im 10/28]
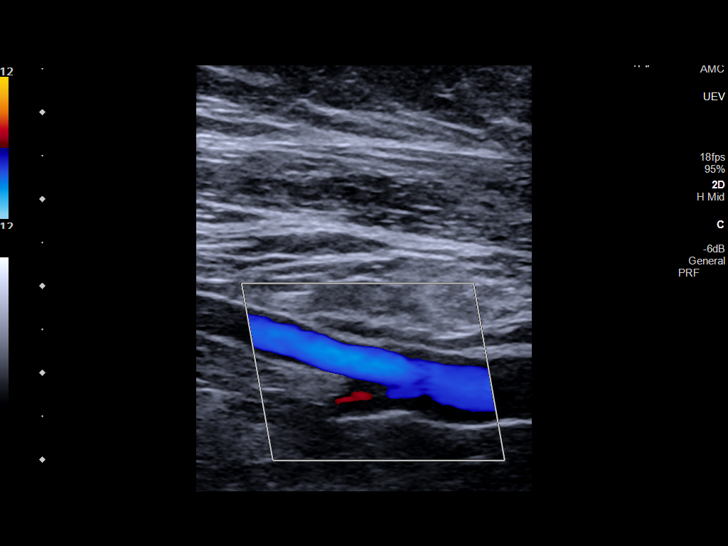
[im 12/28]
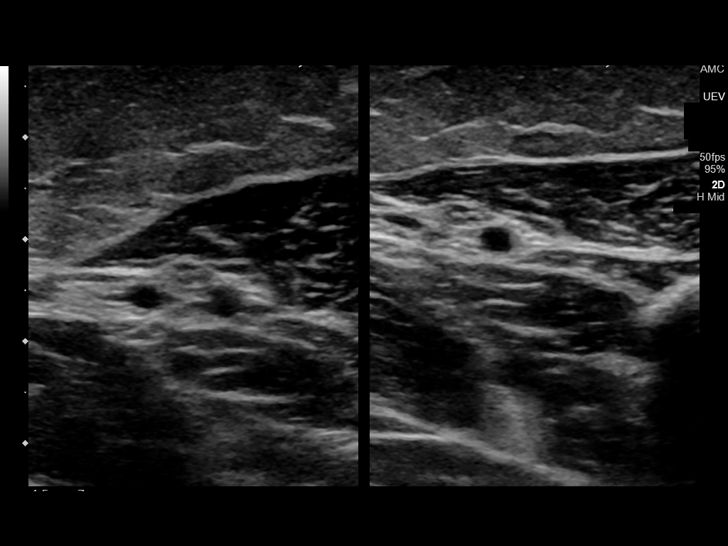
[im 15/28]
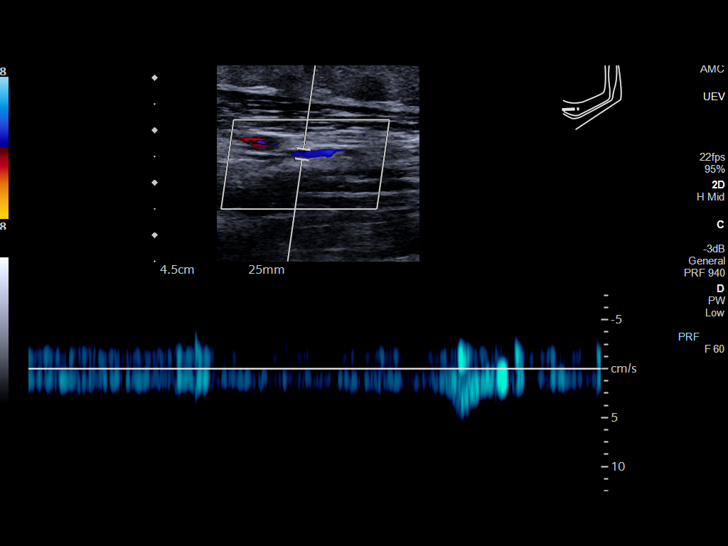
[im 16/28]
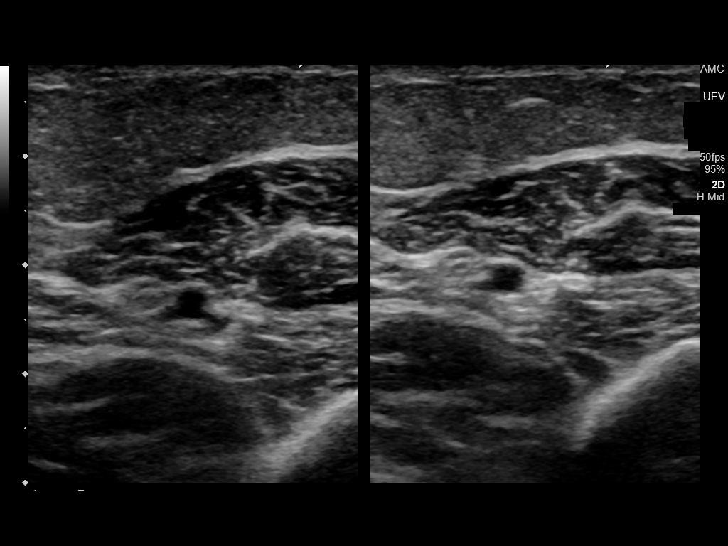
[im 18/28]
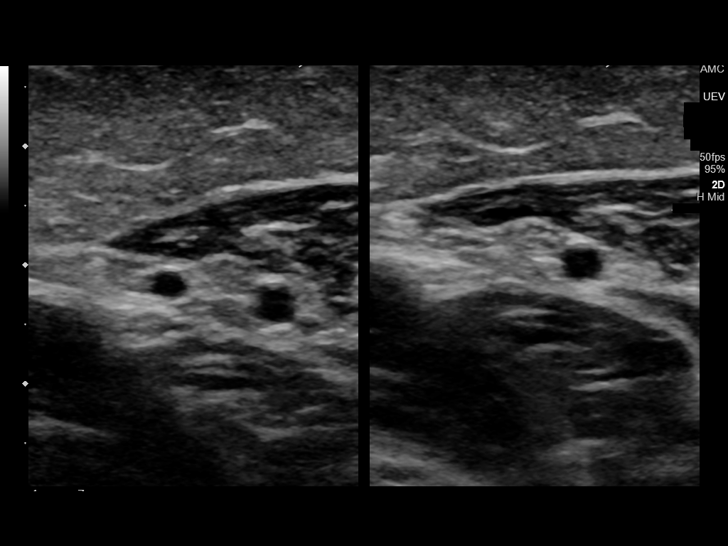
[im 20/28]
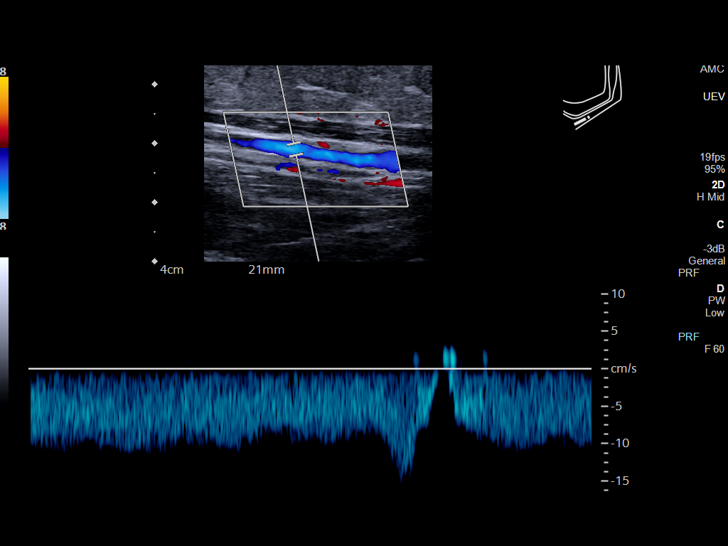
[im 23/28]
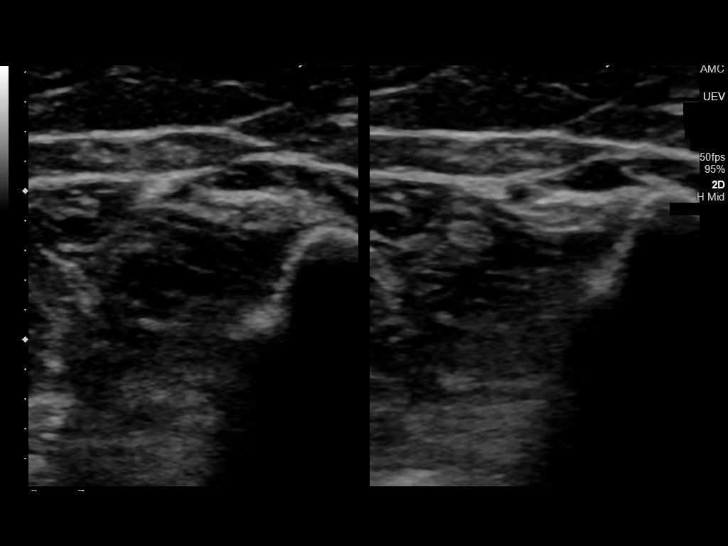
[im 25/28]
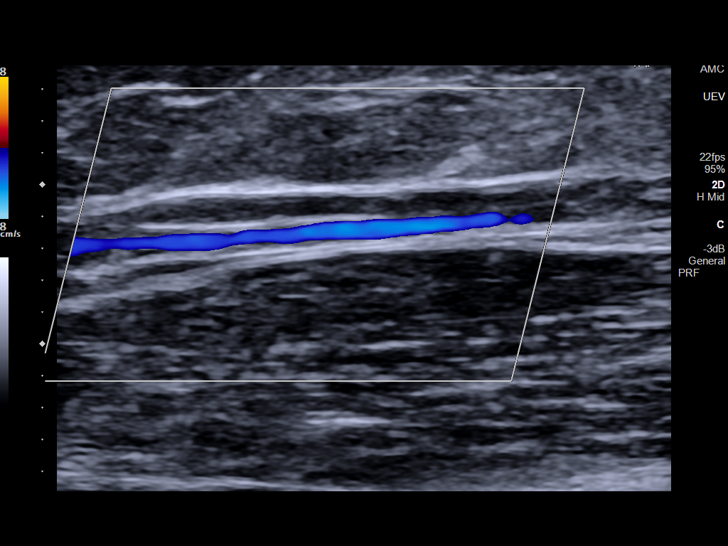
[im 28/28]
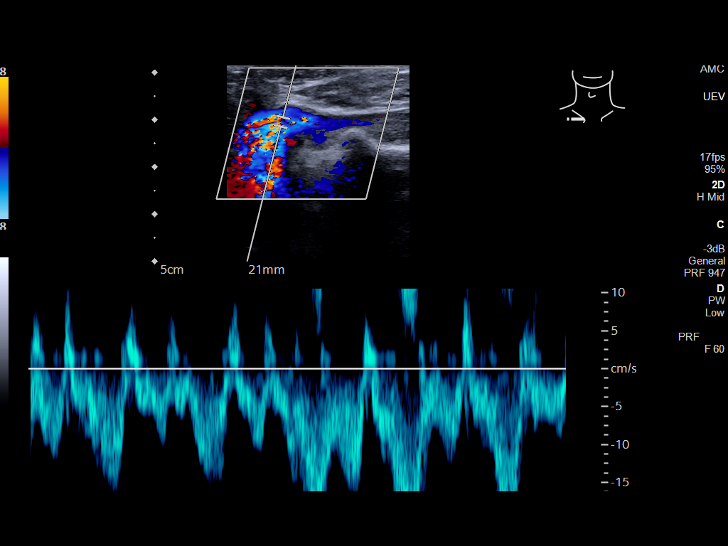

[13 of 24 positions shown; findings below may reference images not displayed]

FINDINGS: Contralateral Subclavian Vein: Respiratory phasicity is normal and
symmetric with the symptomatic side. No evidence of thrombus. Normal
compressibility.

Internal Jugular Vein: No evidence of thrombus. Normal
compressibility, respiratory phasicity and response to augmentation.

Subclavian Vein: No evidence of thrombus. Normal compressibility,
respiratory phasicity and response to augmentation.

Axillary Vein: No evidence of thrombus. Normal compressibility,
respiratory phasicity and response to augmentation.

Cephalic Vein: No evidence of thrombus. Normal compressibility,
respiratory phasicity and response to augmentation.

Basilic Vein: No evidence of thrombus. Normal compressibility,
respiratory phasicity and response to augmentation.

Brachial Veins: No evidence of thrombus. Normal compressibility,
respiratory phasicity and response to augmentation.

Radial Veins: No evidence of thrombus. Normal compressibility,
respiratory phasicity and response to augmentation.

Ulnar Veins: No evidence of thrombus. Normal compressibility,
respiratory phasicity and response to augmentation.

Venous Reflux:  None visualized.

Other Findings:  None visualized.
IMPRESSION: No evidence of DVT within the left upper extremity.

## 2021-08-27 IMAGING — CT CT HEAD W/O CM
4 series · 15 of 47 positions shown, 17 images · non-contrast
Comparison: None.

CLINICAL DATA: Head trauma

EXAM:
CT HEAD WITHOUT CONTRAST
TECHNIQUE: Contiguous axial images were obtained from the base of the skull
through the vertex without intravenous contrast.

[Series 2: head wo · axial · 0.43mm/px · z∈[-161,-46]mm · 7 of 31 slices shown, 9 images]
[im 4/31  brain]
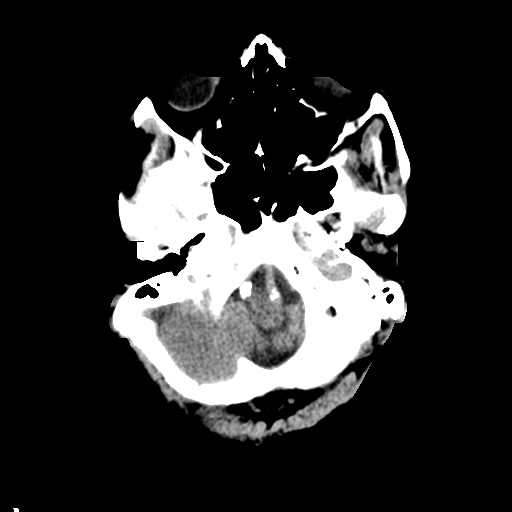
[im 4/31  bone]
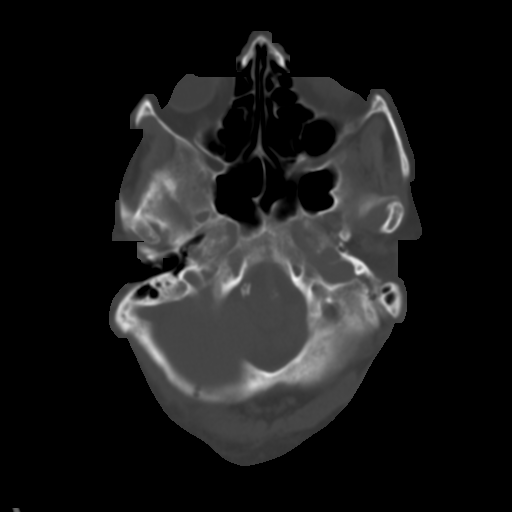
[im 8/31  brain]
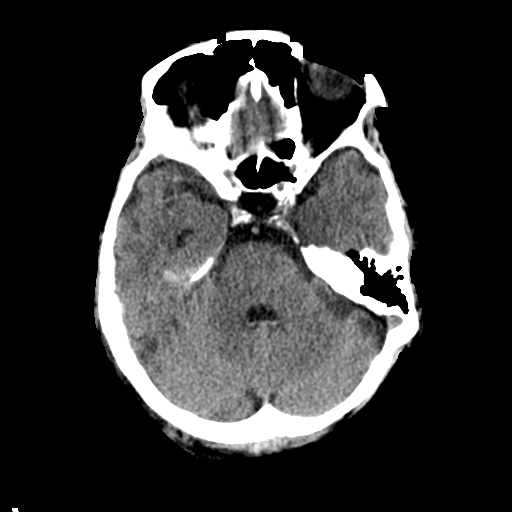
[im 12/31  brain]
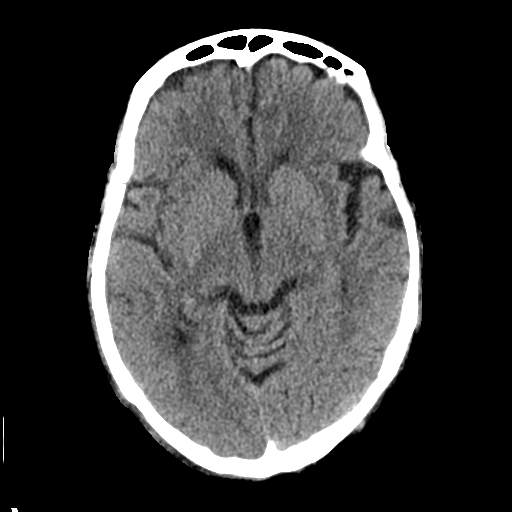
[im 16/31  brain]
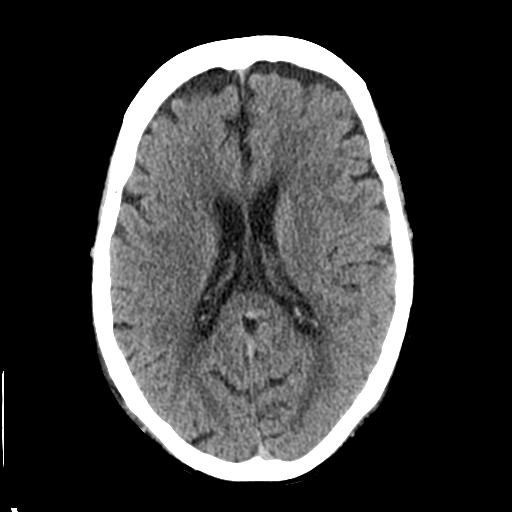
[im 19/31  brain]
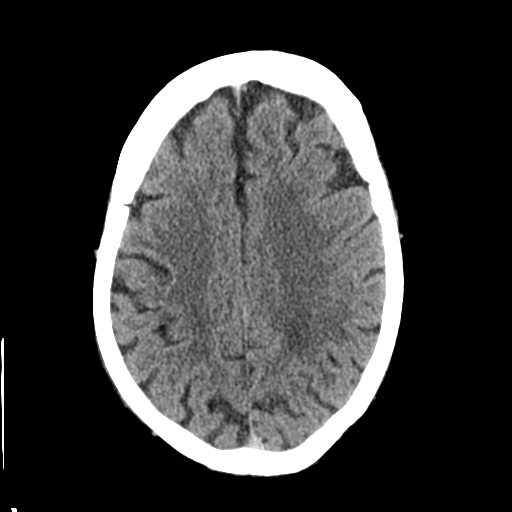
[im 19/31  bone]
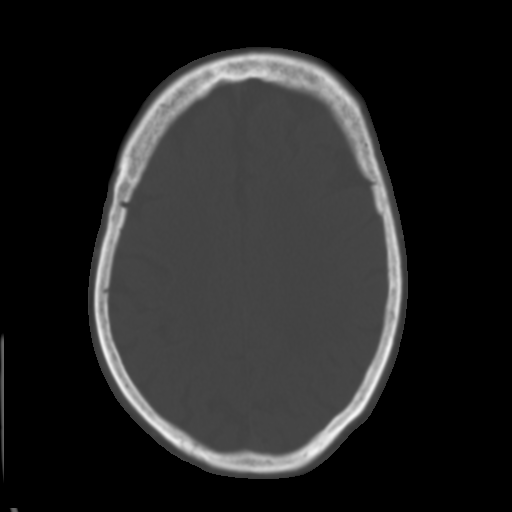
[im 23/31  brain]
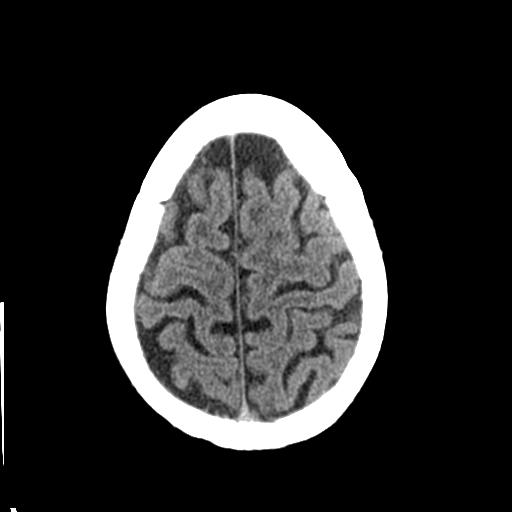
[im 27/31  brain]
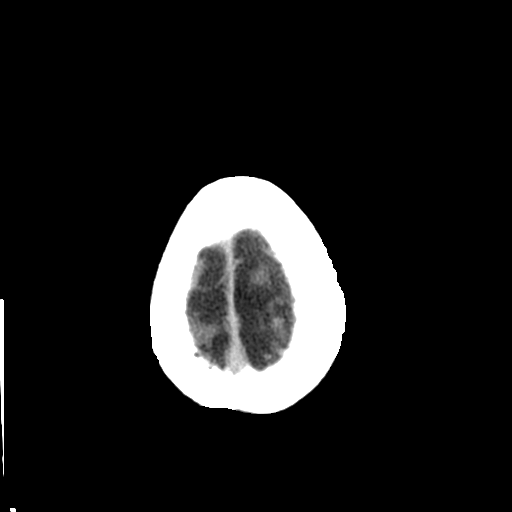

[Series 3: head bone · axial · 0.43mm/px · z∈[-162,-146]mm · 2 of 77 slices shown]
[im 8/77  bone]
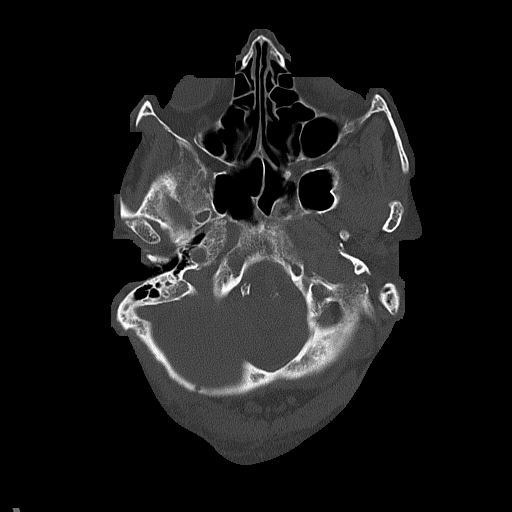
[im 16/77  bone]
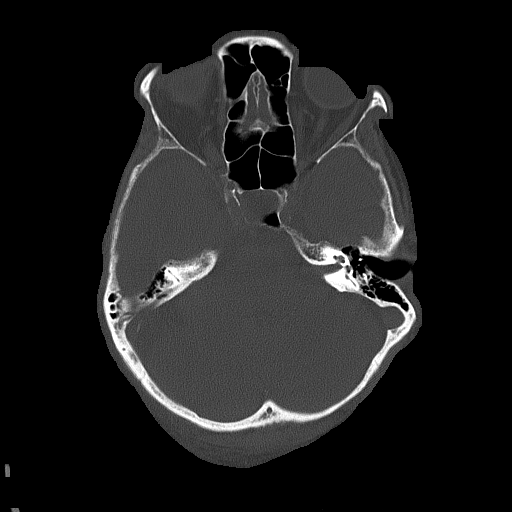

[Series 4: coronal soft tissue · coronal · 0.30mm/px · 3 of 65 slices shown]
[im 22/65  brain]
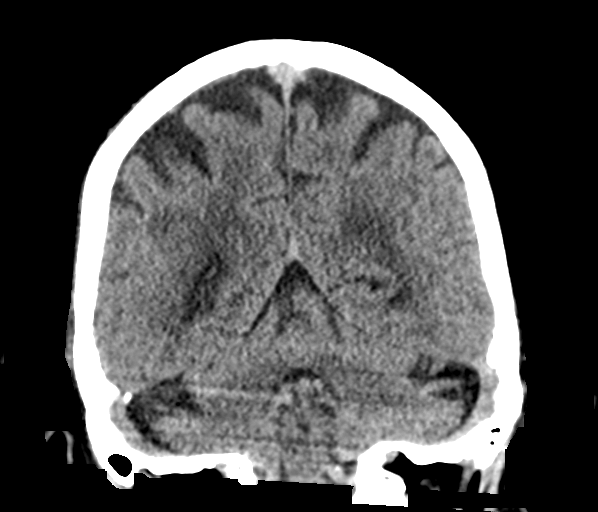
[im 29/65  brain]
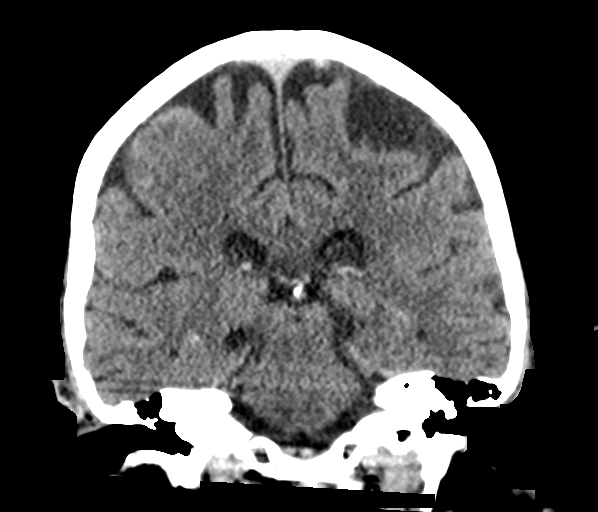
[im 36/65  brain]
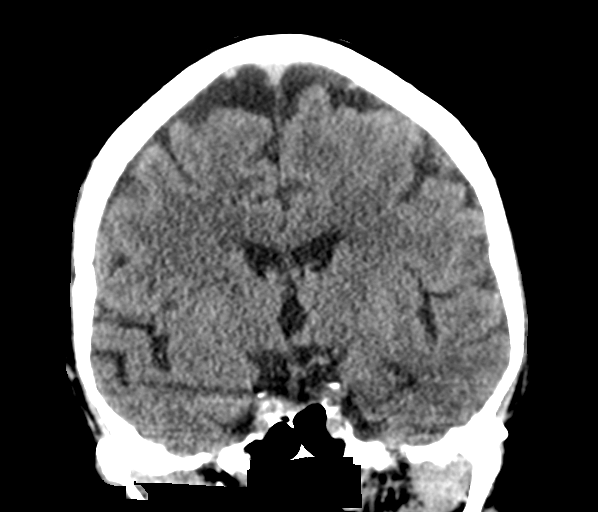

[Series 5: sagittal soft tissue · sagittal · 0.31mm/px · 3 of 49 slices shown]
[im 17/49  brain]
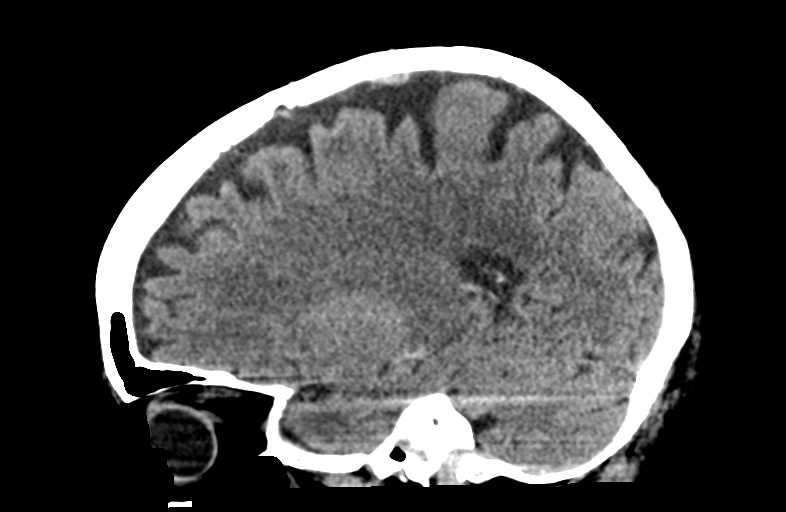
[im 25/49  brain]
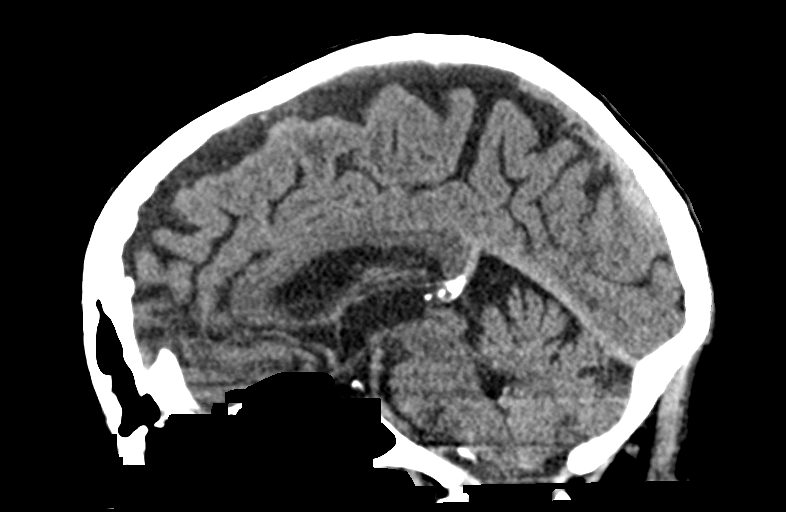
[im 33/49  brain]
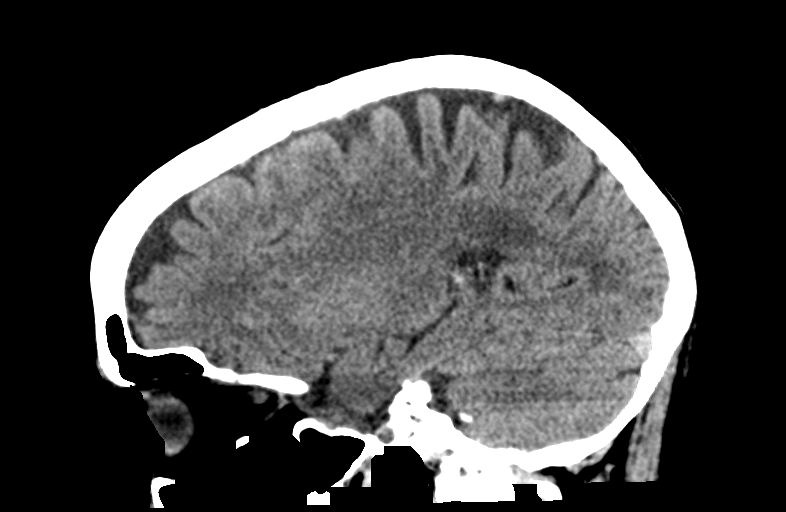

[15 of 47 positions shown; findings below may reference images not displayed]

FINDINGS: Brain: No acute territorial infarction, hemorrhage or intracranial
mass. Mild atrophy. Mild hypodensity in the white matter consistent
with chronic small vessel ischemic change. Nonenlarged ventricles

Vascular: Vertebral and carotid vascular calcifications. No
hyperdense vessels

Skull: Normal. Negative for fracture or focal lesion.

Sinuses/Orbits: No acute finding.

Other: None
IMPRESSION: 1. No CT evidence for acute intracranial abnormality.
2. Atrophy and mild chronic small vessel ischemic changes of the
white matter.

## 2021-12-31 ENCOUNTER — Ambulatory Visit: Payer: Medicare Other | Admitting: Dermatology

## 2022-02-08 ENCOUNTER — Ambulatory Visit: Payer: Medicare Other | Admitting: Dermatology

## 2022-03-03 ENCOUNTER — Encounter: Payer: Self-pay | Admitting: Dermatology

## 2022-03-03 ENCOUNTER — Ambulatory Visit (INDEPENDENT_AMBULATORY_CARE_PROVIDER_SITE_OTHER): Payer: Medicare Other | Admitting: Dermatology

## 2022-03-03 DIAGNOSIS — L570 Actinic keratosis: Secondary | ICD-10-CM

## 2022-03-03 DIAGNOSIS — D229 Melanocytic nevi, unspecified: Secondary | ICD-10-CM

## 2022-03-03 DIAGNOSIS — Z85828 Personal history of other malignant neoplasm of skin: Secondary | ICD-10-CM

## 2022-03-03 DIAGNOSIS — L814 Other melanin hyperpigmentation: Secondary | ICD-10-CM

## 2022-03-03 DIAGNOSIS — D1800 Hemangioma unspecified site: Secondary | ICD-10-CM

## 2022-03-03 DIAGNOSIS — L578 Other skin changes due to chronic exposure to nonionizing radiation: Secondary | ICD-10-CM

## 2022-03-03 DIAGNOSIS — L821 Other seborrheic keratosis: Secondary | ICD-10-CM

## 2022-03-03 DIAGNOSIS — L820 Inflamed seborrheic keratosis: Secondary | ICD-10-CM

## 2022-03-03 DIAGNOSIS — Z1283 Encounter for screening for malignant neoplasm of skin: Secondary | ICD-10-CM | POA: Diagnosis not present

## 2022-03-03 NOTE — Progress Notes (Signed)
Follow-Up Visit   Subjective  Lori Ferguson is a 88 y.o. female who presents for the following: Annual Exam (Hx BCC, SCC, AK's ). The patient presents for Total-Body Skin Exam (TBSE) for skin cancer screening and mole check.  The patient has spots, moles and lesions to be evaluated, some may be new or changing and the patient has concerns that these could be cancer.  The following portions of the chart were reviewed this encounter and updated as appropriate:   Tobacco  Allergies  Meds  Problems  Med Hx  Surg Hx  Fam Hx     Review of Systems:  No other skin or systemic complaints except as noted in HPI or Assessment and Plan.  Objective  Well appearing patient in no apparent distress; mood and affect are within normal limits.  A full examination was performed including scalp, head, eyes, ears, nose, lips, neck, chest, axillae, abdomen, back, buttocks, bilateral upper extremities, bilateral lower extremities, hands, feet, fingers, toes, fingernails, and toenails. All findings within normal limits unless otherwise noted below.  R ear above the anti tragus, L cheek, R infra orbital (3) Erythematous thin papules/macules with gritty scale.   L shoulder x 1 Erythematous stuck-on, waxy papule or plaque   Assessment & Plan  AK (actinic keratosis) (3) R ear above the anti tragus, L cheek, R infra orbital Destruction of lesion - R ear above the anti tragus, L cheek, R infra orbital Complexity: simple   Destruction method: cryotherapy   Informed consent: discussed and consent obtained   Timeout:  patient name, date of birth, surgical site, and procedure verified Lesion destroyed using liquid nitrogen: Yes   Region frozen until ice ball extended beyond lesion: Yes   Outcome: patient tolerated procedure well with no complications   Post-procedure details: wound care instructions given    Inflamed seborrheic keratosis L shoulder x 1 Symptomatic, irritating, patient would like  treated. Destruction of lesion - L shoulder x 1 Complexity: simple   Destruction method: cryotherapy   Informed consent: discussed and consent obtained   Timeout:  patient name, date of birth, surgical site, and procedure verified Lesion destroyed using liquid nitrogen: Yes   Region frozen until ice ball extended beyond lesion: Yes   Outcome: patient tolerated procedure well with no complications   Post-procedure details: wound care instructions given    Lentigines - Scattered tan macules - Due to sun exposure - Benign-appearing, observe - Recommend daily broad spectrum sunscreen SPF 30+ to sun-exposed areas, reapply every 2 hours as needed. - Call for any changes  Seborrheic Keratoses - Stuck-on, waxy, tan-brown papules and/or plaques  - Benign-appearing - Discussed benign etiology and prognosis. - Observe - Call for any changes  Melanocytic Nevi - Tan-brown and/or pink-flesh-colored symmetric macules and papules - Benign appearing on exam today - Observation - Call clinic for new or changing moles - Recommend daily use of broad spectrum spf 30+ sunscreen to sun-exposed areas.   Hemangiomas - Red papules - Discussed benign nature - Observe - Call for any changes  Actinic Damage - Chronic condition, secondary to cumulative UV/sun exposure - diffuse scaly erythematous macules with underlying dyspigmentation - Recommend daily broad spectrum sunscreen SPF 30+ to sun-exposed areas, reapply every 2 hours as needed.  - Staying in the shade or wearing long sleeves, sun glasses (UVA+UVB protection) and wide brim hats (4-inch brim around the entire circumference of the hat) are also recommended for sun protection.  - Call for new or changing lesions.  History of Squamous Cell Carcinoma of the Skin - No evidence of recurrence today - No lymphadenopathy - Recommend regular full body skin exams - Recommend daily broad spectrum sunscreen SPF 30+ to sun-exposed areas, reapply every  2 hours as needed.  - Call if any new or changing lesions are noted between office visits  History of Basal Cell Carcinoma of the Skin - No evidence of recurrence today - Recommend regular full body skin exams - Recommend daily broad spectrum sunscreen SPF 30+ to sun-exposed areas, reapply every 2 hours as needed.  - Call if any new or changing lesions are noted between office visits  Skin cancer screening performed today.  Return in about 1 year (around 03/04/2023) for TBSE.  Luther Redo, CMA, am acting as scribe for Sarina Ser, MD . Documentation: I have reviewed the above documentation for accuracy and completeness, and I agree with the above.  Sarina Ser, MD

## 2022-03-03 NOTE — Patient Instructions (Signed)
Due to recent changes in healthcare laws, you may see results of your pathology and/or laboratory studies on MyChart before the doctors have had a chance to review them. We understand that in some cases there may be results that are confusing or concerning to you. Please understand that not all results are received at the same time and often the doctors may need to interpret multiple results in order to provide you with the best plan of care or course of treatment. Therefore, we ask that you please give us 2 business days to thoroughly review all your results before contacting the office for clarification. Should we see a critical lab result, you will be contacted sooner.   If You Need Anything After Your Visit  If you have any questions or concerns for your doctor, please call our main line at [phone] and press option 4 to reach your doctor's medical assistant. If no one answers, please leave a voicemail as directed and we will return your call as soon as possible. Messages left after 4 pm will be answered the following business day.   You may also send us a message via MyChart. We typically respond to MyChart messages within 1-2 business days.  For prescription refills, please ask your pharmacy to contact our office. Our fax number is [phone].  If you have an urgent issue when the clinic is closed that cannot wait until the next business day, you can page your doctor at the number below.    Please note that while we do our best to be available for urgent issues outside of office hours, we are not available 24/7.   If you have an urgent issue and are unable to reach us, you may choose to seek medical care at your doctor's office, retail clinic, urgent care center, or emergency room.  If you have a medical emergency, please immediately call 911 or go to the emergency department.  Pager Numbers  - Dr. Kowalski: [phone]  - Dr. Moye: [phone]  - Dr. Stewart:  [phone]  In the event of inclement weather, please call our main line at [phone] for an update on the status of any delays or closures.  Dermatology Medication Tips: Please keep the boxes that topical medications come in in order to help keep track of the instructions about where and how to use these. Pharmacies typically print the medication instructions only on the boxes and not directly on the medication tubes.   If your medication is too expensive, please contact our office at [phone] option 4 or send us a message through MyChart.   We are unable to tell what your co-pay for medications will be in advance as this is different depending on your insurance coverage. However, we may be able to find a substitute medication at lower cost or fill out paperwork to get insurance to cover a needed medication.   If a prior authorization is required to get your medication covered by your insurance company, please allow us 1-2 business days to complete this process.  Drug prices often vary depending on where the prescription is filled and some pharmacies may offer cheaper prices.  The website www.goodrx.com contains coupons for medications through different pharmacies. The prices here do not account for what the cost may be with help from insurance (it may be cheaper with your insurance), but the website can give you the price if you did not use any insurance.  - You can print the associated coupon and take it with   your prescription to the pharmacy.  - You may also stop by our office during regular business hours and pick up a GoodRx coupon card.  - If you need your prescription sent electronically to a different pharmacy, notify our office through [place] MyChart or by phone at [phone] option 4.     Si Usted Necesita Algo Despus de Su Visita  Tambin puede enviarnos un mensaje a travs de MyChart. Por lo general respondemos a los mensajes de MyChart en el transcurso de 1 a 2  das hbiles.  Para renovar recetas, por favor pida a su farmacia que se ponga en contacto con nuestra oficina. Nuestro nmero de fax es el [phone].  Si tiene un asunto urgente cuando la clnica est cerrada y que no puede esperar hasta el siguiente da hbil, puede llamar/localizar a su doctor(a) al nmero que aparece a continuacin.   Por favor, tenga en cuenta que aunque hacemos todo lo posible para estar disponibles para asuntos urgentes fuera del horario de oficina, no estamos disponibles las 24 horas del da, los 7 das de la semana.   Si tiene un problema urgente y no puede comunicarse con nosotros, puede optar por buscar atencin mdica  en el consultorio de su doctor(a), en una clnica privada, en un centro de atencin urgente o en una sala de emergencias.  Si tiene una emergencia mdica, por favor llame inmediatamente al 911 o vaya a la sala de emergencias.  Nmeros de bper  - Dr. Kowalski: [phone]  - Dra. Moye: [phone]  - Dra. Stewart: [phone]  En caso de inclemencias del tiempo, por favor llame a nuestra lnea principal al [phone] para una actualizacin sobre el estado de cualquier retraso o cierre.  Consejos para la medicacin en dermatologa: Por favor, guarde las cajas en las que vienen los medicamentos de uso tpico para ayudarle a seguir las instrucciones sobre dnde y cmo usarlos. Las farmacias generalmente imprimen las instrucciones del medicamento slo en las cajas y no directamente en los tubos del medicamento.   Si su medicamento es muy caro, por favor, pngase en contacto con nuestra oficina llamando al [phone] y presione la opcin 4 o envenos un mensaje a travs de MyChart.   No podemos decirle cul ser su copago por los medicamentos por adelantado ya que esto es diferente dependiendo de la cobertura de su seguro. Sin embargo, es posible que podamos encontrar un medicamento sustituto a menor costo o llenar un formulario para que el  seguro cubra el medicamento que se considera necesario.   Si se requiere una autorizacin previa para que su compaa de seguros cubra su medicamento, por favor permtanos de 1 a 2 das hbiles para completar este proceso.  Los precios de los medicamentos varan con frecuencia dependiendo del lugar de dnde se surte la receta y alguna farmacias pueden ofrecer precios ms baratos.  El sitio web www.goodrx.com tiene cupones para medicamentos de diferentes farmacias. Los precios aqu no tienen en cuenta lo que podra costar con la ayuda del seguro (puede ser ms barato con su seguro), pero el sitio web puede darle el precio si no utiliz ningn seguro.  - Puede imprimir el cupn correspondiente y llevarlo con su receta a la farmacia.  - Tambin puede pasar por nuestra oficina durante el horario de atencin regular y recoger una tarjeta de cupones de GoodRx.  - Si necesita que su receta se enve electrnicamente a una farmacia diferente, informe a nuestra oficina a travs de MyChart de [place]   o por telfono llamando al [phone] y presione la opcin 4.  

## 2023-03-16 ENCOUNTER — Telehealth: Payer: Self-pay | Admitting: Internal Medicine

## 2023-03-16 NOTE — Telephone Encounter (Signed)
Error

## 2023-03-30 ENCOUNTER — Ambulatory Visit (INDEPENDENT_AMBULATORY_CARE_PROVIDER_SITE_OTHER): Payer: Medicare Other | Admitting: Dermatology

## 2023-03-30 ENCOUNTER — Encounter: Payer: Self-pay | Admitting: Dermatology

## 2023-03-30 DIAGNOSIS — L570 Actinic keratosis: Secondary | ICD-10-CM

## 2023-03-30 DIAGNOSIS — L814 Other melanin hyperpigmentation: Secondary | ICD-10-CM

## 2023-03-30 DIAGNOSIS — L578 Other skin changes due to chronic exposure to nonionizing radiation: Secondary | ICD-10-CM

## 2023-03-30 DIAGNOSIS — D229 Melanocytic nevi, unspecified: Secondary | ICD-10-CM

## 2023-03-30 DIAGNOSIS — D1801 Hemangioma of skin and subcutaneous tissue: Secondary | ICD-10-CM

## 2023-03-30 DIAGNOSIS — D0439 Carcinoma in situ of skin of other parts of face: Secondary | ICD-10-CM | POA: Diagnosis not present

## 2023-03-30 DIAGNOSIS — W908XXA Exposure to other nonionizing radiation, initial encounter: Secondary | ICD-10-CM | POA: Diagnosis not present

## 2023-03-30 DIAGNOSIS — Z8589 Personal history of malignant neoplasm of other organs and systems: Secondary | ICD-10-CM

## 2023-03-30 DIAGNOSIS — L820 Inflamed seborrheic keratosis: Secondary | ICD-10-CM | POA: Diagnosis not present

## 2023-03-30 DIAGNOSIS — L821 Other seborrheic keratosis: Secondary | ICD-10-CM

## 2023-03-30 DIAGNOSIS — L309 Dermatitis, unspecified: Secondary | ICD-10-CM

## 2023-03-30 DIAGNOSIS — D485 Neoplasm of uncertain behavior of skin: Secondary | ICD-10-CM

## 2023-03-30 DIAGNOSIS — L2089 Other atopic dermatitis: Secondary | ICD-10-CM

## 2023-03-30 DIAGNOSIS — Z7189 Other specified counseling: Secondary | ICD-10-CM

## 2023-03-30 DIAGNOSIS — Z1283 Encounter for screening for malignant neoplasm of skin: Secondary | ICD-10-CM | POA: Diagnosis not present

## 2023-03-30 DIAGNOSIS — I781 Nevus, non-neoplastic: Secondary | ICD-10-CM

## 2023-03-30 DIAGNOSIS — Z85828 Personal history of other malignant neoplasm of skin: Secondary | ICD-10-CM

## 2023-03-30 DIAGNOSIS — I8393 Asymptomatic varicose veins of bilateral lower extremities: Secondary | ICD-10-CM

## 2023-03-30 DIAGNOSIS — Z79899 Other long term (current) drug therapy: Secondary | ICD-10-CM

## 2023-03-30 DIAGNOSIS — C4492 Squamous cell carcinoma of skin, unspecified: Secondary | ICD-10-CM

## 2023-03-30 HISTORY — DX: Squamous cell carcinoma of skin, unspecified: C44.92

## 2023-03-30 MED ORDER — MOMETASONE FUROATE 0.1 % EX CREA: 1.0000 | TOPICAL_CREAM | Freq: Every day | CUTANEOUS | 2 refills | Status: AC | PRN

## 2023-03-30 NOTE — Patient Instructions (Addendum)
 Cryotherapy Aftercare  Wash gently with soap and water everyday.   Apply Vaseline and Band-Aid daily until healed.       Wound Care Instructions  Cleanse wound gently with soap and water once a day then pat dry with clean gauze. Apply a thin coat of Petrolatum (petroleum jelly, "Vaseline") over the wound (unless you have an allergy to this). We recommend that you use a new, sterile tube of Vaseline. Do not pick or remove scabs. Do not remove the yellow or white "healing tissue" from the base of the wound.  Cover the wound with fresh, clean, nonstick gauze and secure with paper tape. You may use Band-Aids in place of gauze and tape if the wound is small enough, but would recommend trimming much of the tape off as there is often too much. Sometimes Band-Aids can irritate the skin.  You should call the office for your biopsy report after 1 week if you have not already been contacted.  If you experience any problems, such as abnormal amounts of bleeding, swelling, significant bruising, significant pain, or evidence of infection, please call the office immediately.  FOR ADULT SURGERY PATIENTS: If you need something for pain relief you may take 1 extra strength Tylenol (acetaminophen) AND 2 Ibuprofen (200mg  each) together every 4 hours as needed for pain. (do not take these if you are allergic to them or if you have a reason you should not take them.) Typically, you may only need pain medication for 1 to 3 days.          Due to recent changes in healthcare laws, you may see results of your pathology and/or laboratory studies on MyChart before the doctors have had a chance to review them. We understand that in some cases there may be results that are confusing or concerning to you. Please understand that not all results are received at the same time and often the doctors may need to interpret multiple results in order to provide you with the best plan of care or course of treatment. Therefore,  we ask that you please give [place] 2 business days to thoroughly review all your results before contacting the office for clarification. Should we see a critical lab result, you will be contacted sooner.   If You Need Anything After Your Visit  If you have any questions or concerns for your doctor, please call our main line at [phone] and press option 4 to reach your doctor's medical assistant. If no one answers, please leave a voicemail as directed and we will return your call as soon as possible. Messages left after 4 pm will be answered the following business day.   You may also send [place] a message via MyChart. We typically respond to MyChart messages within 1-2 business days.  For prescription refills, please ask your pharmacy to contact our office. Our fax number is [phone].  If you have an urgent issue when the clinic is closed that cannot wait until the next business day, you can page your doctor at the number below.    Please note that while we do our best to be available for urgent issues outside of office hours, we are not available 24/7.   If you have an urgent issue and are unable to reach [place], you may choose to seek medical care at your doctor's office, retail clinic, urgent care center, or emergency room.  If you have a medical emergency, please immediately call 911 or go to the emergency department.  Pager Numbers  - Dr. Gwen Pounds: [phone]  - Dr. Roseanne Reno: [phone]  - Dr. Katrinka Blazing: [phone]   In the event of inclement weather, please call our main line at [phone] for an update on the status of any delays or closures.  Dermatology Medication Tips: Please keep the boxes that topical medications come in in order to help keep track of the instructions about where and how to use these. Pharmacies typically print the medication instructions only on the boxes and not directly on the medication tubes.   If your medication is too expensive, please contact our  office at [phone] option 4 or send [place] a message through MyChart.   We are unable to tell what your co-pay for medications will be in advance as this is different depending on your insurance coverage. However, we may be able to find a substitute medication at lower cost or fill out paperwork to get insurance to cover a needed medication.   If a prior authorization is required to get your medication covered by your insurance company, please allow [place] 1-2 business days to complete this process.  Drug prices often vary depending on where the prescription is filled and some pharmacies may offer cheaper prices.  The website www.goodrx.com contains coupons for medications through different pharmacies. The prices here do not account for what the cost may be with help from insurance (it may be cheaper with your insurance), but the website can give you the price if you did not use any insurance.  - You can print the associated coupon and take it with your prescription to the pharmacy.  - You may also stop by our office during regular business hours and pick up a GoodRx coupon card.  - If you need your prescription sent electronically to a different pharmacy, notify our office through [facility] or by phone at [phone] option 4.     Si Usted Necesita Algo Despus de Su Visita  Tambin puede enviarnos un mensaje a travs de Clinical cytogeneticist. Por lo general respondemos a los mensajes de MyChart en el transcurso de 1 a 2 das hbiles.  Para renovar recetas, por favor pida a su farmacia que se ponga en contacto con nuestra oficina. Annie Sable de fax es [place] [phone].  Si tiene un asunto urgente cuando la clnica est cerrada y que no puede esperar hasta el siguiente da hbil, puede llamar/localizar a su doctor(a) al nmero que aparece a continuacin.   Por favor, tenga en cuenta que aunque hacemos todo lo posible para estar disponibles para asuntos urgentes fuera del horario de [place], no  estamos disponibles las 24 horas del da, los 7 [address]  [address] de la [place].   Si tiene un problema urgente y no puede comunicarse con nosotros, puede optar por buscar atencin mdica  en el consultorio de su doctor(a), en una clnica privada, en un centro de atencin urgente o en una sala de emergencias.  Si tiene Engineer, drilling, por favor llame inmediatamente al 911 o vaya a la sala de emergencias.  Nmeros de bper  - Dr. Gwen Pounds: [phone]  - Dra. Roseanne Reno: [phone]  - Dr. Katrinka Blazing: [phone]   En caso de inclemencias del tiempo, por favor llame a Lacy Duverney principal al [phone] para una actualizacin sobre el [place] de cualquier retraso o cierre.  Consejos para la medicacin en dermatologa: Por favor, guarde las cajas en las que vienen los medicamentos de uso tpico para ayudarle a seguir las instrucciones sobre dnde y cmo usarlos. [place] [employer]  generalmente imprimen las instrucciones del medicamento slo en las cajas y no directamente en los tubos del [place].   Si su medicamento es muy caro, por favor, pngase en contacto con Rolm Gala llamando al [phone] y presione la opcin 4 o envenos un mensaje a travs de Clinical cytogeneticist.   No podemos decirle cul ser su copago por los medicamentos por adelantado ya que esto es diferente dependiendo de la cobertura de su seguro. Sin embargo, es posible que podamos encontrar un medicamento sustituto a Audiological scientist un formulario para que el seguro cubra el medicamento que se considera necesario.   Si se requiere una autorizacin previa para que su compaa de seguros [place] su medicamento, por favor permtanos de 1 a 2 das hbiles para completar [address]  Los precios de los medicamentos varan con frecuencia dependiendo del Environmental consultant de dnde se surte la receta y alguna farmacias pueden ofrecer precios ms baratos.  El sitio web www.goodrx.com tiene cupones para medicamentos de Health and safety inspector. Los precios  aqu no tienen en cuenta lo que podra costar con la ayuda del seguro (puede ser ms barato con su seguro), pero el sitio web puede darle el precio si no utiliz Tourist information centre manager.  - Puede imprimir el cupn correspondiente y llevarlo con su receta a la farmacia.  - Tambin puede pasar por nuestra oficina durante el horario de atencin regular y Education officer, museum una tarjeta de cupones de GoodRx.  - Si necesita que su receta se enve electrnicamente a una farmacia diferente, informe a nuestra oficina a travs de MyChart de [place] o por telfono llamando al [phone] y presione la opcin 4.

## 2023-03-30 NOTE — Progress Notes (Signed)
Follow-Up Visit   Subjective  Lori Ferguson is a 89 y.o. female who presents for the following: Skin Cancer Screening and Full Body Skin Exam  The patient presents for Total-Body Skin Exam (TBSE) for skin cancer screening and mole check. The patient has spots, moles and lesions to be evaluated, some may be new or changing and the patient may have concern these could be cancer.  Patient does have hx of BCC and SCC. Uses mometasone for eczema at right ear and would like refills. Scaly spots at face.   The following portions of the chart were reviewed this encounter and updated as appropriate: medications, allergies, medical history  Review of Systems:  No other skin or systemic complaints except as noted in HPI or Assessment and Plan.  Objective  Well appearing patient in no apparent distress; mood and affect are within normal limits.  A full examination was performed including scalp, head, eyes, ears, nose, lips, neck, chest, axillae, abdomen, back, buttocks, bilateral upper extremities, bilateral lower extremities, hands, feet, fingers, toes, fingernails, and toenails. All findings within normal limits unless otherwise noted below.   Relevant physical exam findings are noted in the Assessment and Plan.  Left Forearm x 2 (2) Erythematous stuck-on, waxy papule or plaque  right medial cheek Atrophic patch 0.8 x 0.5cm     right ear antitragus Erythematous thin papules/macules with gritty scale.     Assessment & Plan   SKIN CANCER SCREENING PERFORMED TODAY.  ACTINIC DAMAGE - Chronic condition, secondary to cumulative UV/sun exposure - diffuse scaly erythematous macules with underlying dyspigmentation - Recommend daily broad spectrum sunscreen SPF 30+ to sun-exposed areas, reapply every 2 hours as needed.  - Staying in the shade or wearing long sleeves, sun glasses (UVA+UVB protection) and wide brim hats (4-inch brim around the entire circumference of the hat) are also  recommended for sun protection.  - Call for new or changing lesions.  LENTIGINES, SEBORRHEIC KERATOSES, HEMANGIOMAS - Benign normal skin lesions - Benign-appearing - Call for any changes  MELANOCYTIC NEVI - Tan-brown and/or pink-flesh-colored symmetric macules and papules - Benign appearing on exam today - Observation - Call clinic for new or changing moles - Recommend daily use of broad spectrum spf 30+ sunscreen to sun-exposed areas.   HISTORY OF BASAL CELL CARCINOMA OF THE SKIN - No evidence of recurrence today - Recommend regular full body skin exams - Recommend daily broad spectrum sunscreen SPF 30+ to sun-exposed areas, reapply every 2 hours as needed.  - Call if any new or changing lesions are noted between office visits  HISTORY OF SQUAMOUS CELL CARCINOMA OF THE SKIN - No evidence of recurrence today - No lymphadenopathy - Recommend regular full body skin exams - Recommend daily broad spectrum sunscreen SPF 30+ to sun-exposed areas, reapply every 2 hours as needed.  - Call if any new or changing lesions are noted between office visits  ECZEMA Exam: Scaly pink papules coalescing to plaques Treatment Plan: Continue mometasone once daily to aa right ear, face, body prn  Varicose Veins/Spider Veins - Dilated blue, purple or red veins at the lower extremities - Reassured - Smaller vessels can be treated by sclerotherapy (a procedure to inject a medicine into the veins to make them disappear) if desired, but the treatment is not covered by insurance. Larger vessels may be covered if symptomatic and we would refer to vascular surgeon if treatment desired.  Inflamed seborrheic keratosis (2) Left Forearm x 2  Symptomatic, irritating, patient would like treated.  Benign-appearing.  Call clinic for new or changing lesions.    Destruction of lesion - Left Forearm x 2 (2)  Destruction method: cryotherapy   Informed consent: discussed and consent obtained   Lesion  destroyed using liquid nitrogen: Yes   Cryotherapy cycles:  2 Outcome: patient tolerated procedure well with no complications   Post-procedure details: wound care instructions given    Neoplasm of uncertain behavior of skin right medial cheek  Skin / nail biopsy Type of biopsy: tangential   Informed consent: discussed and consent obtained   Timeout: patient name, date of birth, surgical site, and procedure verified   Procedure prep:  Patient was prepped and draped in usual sterile fashion Prep type:  Isopropyl alcohol Anesthesia: the lesion was anesthetized in a standard fashion   Anesthetic:  1% lidocaine w/ epinephrine 1-100,000 buffered w/ 8.4% NaHCO3 Instrument used: flexible razor blade   Hemostasis achieved with: pressure, aluminum chloride and electrodesiccation   Outcome: patient tolerated procedure well   Post-procedure details: sterile dressing applied and wound care instructions given   Dressing type: bandage and petrolatum    Specimen 1 - Surgical pathology Differential Diagnosis: AK vs SCC vs other  Check Margins: No Atrophic patch 0.8 x 0.5cm  AK (actinic keratosis) right ear antitragus  Actinic keratoses are precancerous spots that appear secondary to cumulative UV radiation exposure/sun exposure over time. They are chronic with expected duration over 1 year. A portion of actinic keratoses will progress to squamous cell carcinoma of the skin. It is not possible to reliably predict which spots will progress to skin cancer and so treatment is recommended to prevent development of skin cancer.  Recommend daily broad spectrum sunscreen SPF 30+ to sun-exposed areas, reapply every 2 hours as needed.  Recommend staying in the shade or wearing long sleeves, sun glasses (UVA+UVB protection) and wide brim hats (4-inch brim around the entire circumference of the hat). Call for new or changing lesions.   Destruction of lesion - right ear antitragus  Destruction method:  cryotherapy   Informed consent: discussed and consent obtained   Lesion destroyed using liquid nitrogen: Yes   Cryotherapy cycles:  2 Outcome: patient tolerated procedure well with no complications   Post-procedure details: wound care instructions given     Return in about 1 year (around 03/29/2024) for TBSE, with Dr. Kirtland Bouchard, Hx SCC, Hx BCC.  Anise Salvo, RMA, am acting as scribe for Armida Sans, MD .   Documentation: I have reviewed the above documentation for accuracy and completeness, and I agree with the above.  Armida Sans, MD

## 2023-04-04 ENCOUNTER — Telehealth: Payer: Self-pay

## 2023-04-04 NOTE — Telephone Encounter (Signed)
Patient informed of pathology results. She prefers ED&C but if she changes her mind she will let us know.

## 2023-04-04 NOTE — Telephone Encounter (Signed)
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  Nevertheless if patient prefers Mohs surgery (I think she mentioned something about this), she may be scheduled for Mohs surgery

## 2023-05-23 ENCOUNTER — Telehealth: Payer: Self-pay

## 2023-05-23 NOTE — Telephone Encounter (Signed)
She had come in and said that she is having a procedure 11/6 with Dr. Kirtland Bouchard and she was wondering since she takes Asprin and collagen if she should stop taking them for a while. She said that she did not want a keloid scar.

## 2023-05-24 NOTE — Telephone Encounter (Signed)
Patient advised. aw

## 2023-06-01 ENCOUNTER — Encounter: Payer: Self-pay | Admitting: Dermatology

## 2023-06-01 ENCOUNTER — Ambulatory Visit: Payer: Medicare Other | Admitting: Dermatology

## 2023-06-01 DIAGNOSIS — D099 Carcinoma in situ, unspecified: Secondary | ICD-10-CM

## 2023-06-01 DIAGNOSIS — D0439 Carcinoma in situ of skin of other parts of face: Secondary | ICD-10-CM | POA: Diagnosis not present

## 2023-06-01 HISTORY — DX: Carcinoma in situ, unspecified: D09.9

## 2023-06-01 NOTE — Progress Notes (Signed)
   Follow-Up Visit   Subjective  Lori Ferguson is a 89 y.o. female who presents for the following: treatment of biopsy proven SCCis. Bx 03/30/2023. Right medial cheek. Here for [facility].  The patient has spots, moles and lesions to be evaluated, some may be new or changing and the patient may have concern these could be cancer.    The following portions of the chart were reviewed this encounter and updated as appropriate: medications, allergies, medical history  Review of Systems:  No other skin or systemic complaints except as noted in HPI or Assessment and Plan.  Objective  Well appearing patient in no apparent distress; mood and affect are within normal limits.  A focused examination was performed of the following areas: Face  Relevant physical exam findings are noted in the Assessment and Plan.  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  Epidermal / dermal shaving  Lesion diameter (cm):  0.8 Informed consent: discussed and consent obtained   Timeout: patient name, date of birth, surgical site, and procedure verified   Procedure prep:  Patient was prepped and draped in usual sterile fashion Prep type:  Isopropyl alcohol Anesthesia: the lesion was anesthetized in a standard fashion   Anesthetic:  1% lidocaine w/ epinephrine 1-100,000 buffered w/ 8.4% NaHCO3 Instrument used: flexible razor blade   Hemostasis achieved with: pressure, aluminum chloride and electrodesiccation   Outcome: patient tolerated procedure well   Post-procedure details: sterile dressing applied and wound care instructions given   Dressing type: bandage and petrolatum    Destruction of lesion Complexity: extensive   Destruction method: electrodesiccation and curettage   Informed consent: discussed and consent obtained   Timeout:  patient name, date of birth, surgical site, and procedure  verified Procedure prep:  Patient was prepped and draped in usual sterile fashion Prep type:  Isopropyl alcohol Anesthesia: the lesion was anesthetized in a standard fashion   Anesthetic:  1% lidocaine w/ epinephrine 1-100,000 buffered w/ 8.4% NaHCO3 Curettage performed in three different directions: Yes   Electrodesiccation performed over the curetted area: Yes   Curettage cycles:  3 Final wound size (cm):  1.1 Hemostasis achieved with:  pressure and aluminum chloride Outcome: patient tolerated procedure well with no complications   Post-procedure details: sterile dressing applied and wound care instructions given   Dressing type: bandage and petrolatum    Specimen 1 - Surgical pathology Differential Diagnosis: Bx proven SCCis, R/O residual SCCis  Check Margins: Yes  Previous pathology: [id number]  EDC today     Return for TBSE As Scheduled.  I, Lawson Radar, CMA, am acting as scribe for Armida Sans, MD.   Documentation: I have reviewed the above documentation for accuracy and completeness, and I agree with the above.  Armida Sans, MD

## 2023-06-01 NOTE — Patient Instructions (Signed)
Wound Care Instructions  Cleanse wound gently with soap and water once a day then pat dry with clean gauze. Apply a thin coat of Petrolatum (petroleum jelly, "Vaseline") over the wound (unless you have an allergy to this). We recommend that you use a new, sterile tube of Vaseline. Do not pick or remove scabs. Do not remove the yellow or white "healing tissue" from the base of the wound.  Cover the wound with fresh, clean, nonstick gauze and secure with paper tape. You may use Band-Aids in place of gauze and tape if the wound is small enough, but would recommend trimming much of the tape off as there is often too much. Sometimes Band-Aids can irritate the skin.  You should call the office for your biopsy report after 1 week if you have not already been contacted.  If you experience any problems, such as abnormal amounts of bleeding, swelling, significant bruising, significant pain, or evidence of infection, please call the office immediately.  FOR ADULT SURGERY PATIENTS: If you need something for pain relief you may take 1 extra strength Tylenol (acetaminophen) AND 2 Ibuprofen (200mg  each) together every 4 hours as needed for pain. (do not take these if you are allergic to them or if you have a reason you should not take them.) Typically, you may only need pain medication for 1 to 3 days.    Recommend daily broad spectrum sunscreen SPF 30+ to sun-exposed areas, reapply every 2 hours as needed. Call for new or changing lesions.  Staying in the shade or wearing long sleeves, sun glasses (UVA+UVB protection) and wide brim hats (4-inch brim around the entire circumference of the hat) are also recommended for sun protection.    Due to recent changes in healthcare laws, you may see results of your pathology and/or laboratory studies on MyChart before the doctors have had a chance to review them. We understand that in some cases there may be results that are confusing or concerning to you. Please understand  that not all results are received at the same time and often the doctors may need to interpret multiple results in order to provide you with the best plan of care or course of treatment. Therefore, we ask that you please give [place] 2 business days to thoroughly review all your results before contacting the office for clarification. Should we see a critical lab result, you will be contacted sooner.   If You Need Anything After Your Visit  If you have any questions or concerns for your doctor, please call our main line at [phone] and press option 4 to reach your doctor's medical assistant. If no one answers, please leave a voicemail as directed and we will return your call as soon as possible. Messages left after 4 pm will be answered the following business day.   You may also send [place] a message via MyChart. We typically respond to MyChart messages within 1-2 business days.  For prescription refills, please ask your pharmacy to contact our office. Our fax number is [phone].  If you have an urgent issue when the clinic is closed that cannot wait until the next business day, you can page your doctor at the number below.    Please note that while we do our best to be available for urgent issues outside of office hours, we are not available 24/7.   If you have an urgent issue and are unable to reach [place], you may choose to seek medical care at your doctor's office,  retail clinic, urgent care center, or emergency room.  If you have a medical emergency, please immediately call 911 or go to the emergency department.  Pager Numbers  - Dr. Gwen Pounds: [phone]  - Dr. Roseanne Reno: [phone]  - Dr. Katrinka Blazing: [phone]   In the event of inclement weather, please call our main line at [phone] for an update on the status of any delays or closures.  Dermatology Medication Tips: Please keep the boxes that topical medications come in in order to help keep track of the instructions about where  and how to use these. Pharmacies typically print the medication instructions only on the boxes and not directly on the medication tubes.   If your medication is too expensive, please contact our office at [phone] option 4 or send [place] a message through MyChart.   We are unable to tell what your co-pay for medications will be in advance as this is different depending on your insurance coverage. However, we may be able to find a substitute medication at lower cost or fill out paperwork to get insurance to cover a needed medication.   If a prior authorization is required to get your medication covered by your insurance company, please allow [place] 1-2 business days to complete this process.  Drug prices often vary depending on where the prescription is filled and some pharmacies may offer cheaper prices.  The website www.goodrx.com contains coupons for medications through different pharmacies. The prices here do not account for what the cost may be with help from insurance (it may be cheaper with your insurance), but the website can give you the price if you did not use any insurance.  - You can print the associated coupon and take it with your prescription to the pharmacy.  - You may also stop by our office during regular business hours and pick up a GoodRx coupon card.  - If you need your prescription sent electronically to a different pharmacy, notify our office through [facility] or by phone at [phone] option 4.     Si Usted Necesita Algo Despus de Su Visita  Tambin puede enviarnos un mensaje a travs de Clinical cytogeneticist. Por lo general respondemos a los mensajes de MyChart en el transcurso de 1 a 2 das hbiles.  Para renovar recetas, por favor pida a su farmacia que se ponga en contacto con nuestra oficina. Annie Sable de fax es [place] [phone].  Si tiene un asunto urgente cuando la clnica est cerrada y que no puede esperar hasta el siguiente da hbil, puede llamar/localizar a  su doctor(a) al nmero que aparece a continuacin.   Por favor, tenga en cuenta que aunque hacemos todo lo posible para estar disponibles para asuntos urgentes fuera del horario de [place], no estamos disponibles las 24 horas del da, los 7 [address]  [address] de la [place].   Si tiene un problema urgente y no puede comunicarse con nosotros, puede optar por buscar atencin mdica  en el consultorio de su doctor(a), en una clnica privada, en un centro de atencin urgente o en una sala de emergencias.  Si tiene Engineer, drilling, por favor llame inmediatamente al 911 o vaya a la sala de emergencias.  Nmeros de bper  - Dr. Gwen Pounds: [phone]  - Dra. Roseanne Reno: [phone]  - Dr. Katrinka Blazing: [phone]   En caso de inclemencias del tiempo, por favor llame a Lacy Duverney principal al [phone] para una actualizacin sobre el [place] de cualquier retraso o cierre.  Consejos para la medicacin en dermatologa: Por  favor, guarde las cajas en las que vienen los medicamentos de uso tpico para ayudarle a seguir las instrucciones sobre dnde y cmo usarlos. Las farmacias generalmente imprimen las instrucciones del medicamento slo en las cajas y no directamente en los tubos del [place].   Si su medicamento es muy caro, por favor, pngase en contacto con Rolm Gala llamando al [phone] y presione la opcin 4 o envenos un mensaje a travs de Clinical cytogeneticist.   No podemos decirle cul ser su copago por los medicamentos por adelantado ya que esto es diferente dependiendo de la cobertura de su seguro. Sin embargo, es posible que podamos encontrar un medicamento sustituto a Audiological scientist un formulario para que el seguro cubra el medicamento que se considera necesario.   Si se requiere una autorizacin previa para que su compaa de seguros [place] su medicamento, por favor permtanos de 1 a 2 das hbiles para completar [address]  Los precios de los medicamentos varan con frecuencia dependiendo  del Environmental consultant de dnde se surte la receta y alguna farmacias pueden ofrecer precios ms baratos.  El sitio web www.goodrx.com tiene cupones para medicamentos de Health and safety inspector. Los precios aqu no tienen en cuenta lo que podra costar con la ayuda del seguro (puede ser ms barato con su seguro), pero el sitio web puede darle el precio si no utiliz Tourist information centre manager.  - Puede imprimir el cupn correspondiente y llevarlo con su receta a la farmacia.  - Tambin puede pasar por nuestra oficina durante el horario de atencin regular y Education officer, museum una tarjeta de cupones de GoodRx.  - Si necesita que su receta se enve electrnicamente a una farmacia diferente, informe a nuestra oficina a travs de MyChart de [place] o por telfono llamando al [phone] y presione la opcin 4.

## 2023-06-08 ENCOUNTER — Telehealth: Payer: Self-pay

## 2023-06-08 LAB — SURGICAL PATHOLOGY

## 2023-06-08 NOTE — Telephone Encounter (Addendum)
Called patient and discussed bx results. Patient verbalized understanding. Will recheck area at next follow up. Patient scheduled.  Patient was instructed to call if she noticed any spots or areas of concern before then.  ----- Message from Lori Ferguson sent at 06/08/2023  5:33 PM EST ----- FINAL DIAGNOSIS        1. Skin, right medial cheek :       RESIDUAL SQUAMOUS CELL CARCINOMA IN SITU, PERIPHERAL MARGIN INVOLVED   Cancer = SCCis Superficial Already treated with EDC Recheck next visit

## 2023-06-27 ENCOUNTER — Encounter: Payer: Self-pay | Admitting: Dermatology

## 2023-06-27 ENCOUNTER — Ambulatory Visit: Payer: Medicare Other | Admitting: Dermatology

## 2023-06-27 DIAGNOSIS — Z85828 Personal history of other malignant neoplasm of skin: Secondary | ICD-10-CM | POA: Diagnosis not present

## 2023-06-27 DIAGNOSIS — L821 Other seborrheic keratosis: Secondary | ICD-10-CM | POA: Diagnosis not present

## 2023-06-27 DIAGNOSIS — L820 Inflamed seborrheic keratosis: Secondary | ICD-10-CM

## 2023-06-27 NOTE — Progress Notes (Signed)
   Follow-Up Visit   Subjective  Lori Ferguson is a 89 y.o. female who presents for the following: spot at right eye, present for a few months. Sscaly spot at left arm. Patient with hx of SCC, BCC.   The patient has spots, moles and lesions to be evaluated, some may be new or changing and the patient may have concern these could be cancer.   The following portions of the chart were reviewed this encounter and updated as appropriate: medications, allergies, medical history  Review of Systems:  No other skin or systemic complaints except as noted in HPI or Assessment and Plan.  Objective  Well appearing patient in no apparent distress; mood and affect are within normal limits.   A focused examination was performed of the following areas: face  Relevant exam findings are noted in the Assessment and Plan.  L medial forearm x 1, R forearm x 1 (2) Erythematous stuck-on, waxy papule or plaque    Assessment & Plan   SEBORRHEIC KERATOSIS - Stuck-on, waxy, tan-brown papules and/or plaques  - Benign-appearing - Discussed benign etiology and prognosis. - Observe - Call for any changes    Inflamed seborrheic keratosis (2) L medial forearm x 1, R forearm x 1  Symptomatic, irritating, patient would like treated.  Benign-appearing.  Call clinic for new or changing lesions.    Destruction of lesion - L medial forearm x 1, R forearm x 1 (2) Complexity: simple   Destruction method: cryotherapy   Informed consent: discussed and consent obtained   Timeout:  patient name, date of birth, surgical site, and procedure verified Lesion destroyed using liquid nitrogen: Yes   Region frozen until ice ball extended beyond lesion: Yes   Cryo cycles: 1 or 2. Outcome: patient tolerated procedure well with no complications   Post-procedure details: wound care instructions given    Seborrheic keratoses    Return for TBSE, with Dr. Kirtland Bouchard, as scheduled, Hx BCC, Hx SCCis, Hx SCC.  Anise Salvo, RMA, am acting as scribe for Elie Goody, MD .   Documentation: I have reviewed the above documentation for accuracy and completeness, and I agree with the above.  Elie Goody, MD

## 2023-06-27 NOTE — Patient Instructions (Signed)

## 2023-09-01 ENCOUNTER — Emergency Department: Payer: Medicare Other

## 2023-09-01 ENCOUNTER — Emergency Department
Admission: EM | Admit: 2023-09-01 | Discharge: 2023-09-01 | Disposition: A | Discharge status: Home / Self Care | Payer: Medicare Other | Attending: Emergency Medicine | Admitting: Emergency Medicine

## 2023-09-01 ENCOUNTER — Other Ambulatory Visit: Payer: Self-pay

## 2023-09-01 DIAGNOSIS — R052 Subacute cough: Secondary | ICD-10-CM | POA: Insufficient documentation

## 2023-09-01 DIAGNOSIS — R0602 Shortness of breath: Secondary | ICD-10-CM | POA: Diagnosis present

## 2023-09-01 DIAGNOSIS — R0981 Nasal congestion: Secondary | ICD-10-CM | POA: Insufficient documentation

## 2023-09-01 LAB — BASIC METABOLIC PANEL
Anion gap: 10 (ref 5–15)
BUN: 16 mg/dL (ref 8–23)
CO2: 24 mmol/L (ref 22–32)
Calcium: 9.1 mg/dL (ref 8.9–10.3)
Chloride: 102 mmol/L (ref 98–111)
Creatinine, Ser: 0.53 mg/dL (ref 0.44–1.00)
GFR, Estimated: >60 mL/min (ref >60)
Glucose, Bld: 104 mg/dL — ABNORMAL HIGH (ref 70–99)
Potassium: 4.1 mmol/L (ref 3.5–5.1)
Sodium: 136 mmol/L (ref 135–145)

## 2023-09-01 LAB — CBC
HCT: 45.7 % (ref 36.0–46.0)
Hemoglobin: 14.6 g/dL (ref 12.0–15.0)
MCH: 30.4 pg (ref 26.0–34.0)
MCHC: 31.9 g/dL (ref 30.0–36.0)
MCV: 95.2 fL (ref 80.0–100.0)
Platelets: 253 10*3/uL (ref 150–400)
RBC: 4.8 MIL/uL (ref 3.87–5.11)
RDW: 13.2 % (ref 11.5–15.5)
WBC: 11.8 10*3/uL — ABNORMAL HIGH (ref 4.0–10.5)
nRBC: 0 % (ref 0.0–0.2)

## 2023-09-01 LAB — TROPONIN I (HIGH SENSITIVITY): Troponin I (High Sensitivity): 5 ng/L (ref <18)

## 2023-09-01 MED ORDER — AZITHROMYCIN 250 MG PO TABS: ORAL_TABLET | ORAL | 0 refills | Status: AC

## 2023-09-01 MED ORDER — IOHEXOL 350 MG/ML SOLN
75.0000 mL | Freq: Once | INTRAVENOUS | Status: AC | PRN
  Administered 2023-09-01: 75 mL via INTRAVENOUS

## 2023-09-01 NOTE — ED Notes (Signed)
 See triage note  Presents with cough which she has had for about 2 months  Cough has been intermittently for couple of month  States it has been productive at times   No fever States that her O2 sats were low at PCP's office  On arrival to ED O2 sats are 96-98% on RA  Denies any pain

## 2023-09-01 NOTE — ED Provider Notes (Signed)
 [facility] Provider Note    Event Date/Time   First MD Initiated Contact with Patient 09/01/23 1738     (approximate)   History   Shortness of Breath   HPI Lori Ferguson is a 89 y.o. female presenting today for shortness of breath.  Patient states for the past month she has intermittent cough, congestion, and shortness of breath.  It had improved until several days ago and it came back.  She is having mild chest pain associated with the cough as well.  She had negative COVID, flu, and RSV testing yesterday and then saw her PCP today.  While there, she reportedly had an oxygen saturation between 85 and 87%.  They told her to come to the ED for further evaluation.  Currently, she only notes the cough and denies any shortness of breath or chest pain.  Chart review: Patient had negative COVID, flu, and RSV testing yesterday.     Physical Exam   Triage Vital Signs: ED Triage Vitals  Encounter Vitals Group     BP 09/01/23 1504 138/87     Systolic BP Percentile --      Diastolic BP Percentile --      Pulse Rate 09/01/23 1504 93     Resp 09/01/23 1504 20     Temp 09/01/23 1504 98.4 F (36.9 C)     Temp Source 09/01/23 1504 Oral     SpO2 09/01/23 1504 92 %     Weight --      Height --      Head Circumference --      Peak Flow --      Pain Score 09/01/23 1505 0     Pain Loc --      Pain Education --      Exclude from Growth Chart --     Most recent vital signs: Vitals:   09/01/23 1504 09/01/23 1905  BP: 138/87 (!) 126/56  Pulse: 93 88  Resp: 20 20  Temp: 98.4 F (36.9 C)   SpO2: 92% 94%   Physical Exam: I have reviewed the vital signs and nursing notes. General: Awake, alert, no acute distress.  Nontoxic appearing. Head:  Atraumatic, normocephalic.   ENT:  EOM intact, PERRL. Oral mucosa is pink and moist with no lesions. Neck: Neck is supple with full range of motion, No meningeal signs. Cardiovascular:  RRR, No murmurs. Peripheral  pulses palpable and equal bilaterally. Respiratory:  Symmetrical chest wall expansion.  No rhonchi, rales, or wheezes.  Good air movement throughout.  No use of accessory muscles.   Musculoskeletal:  No cyanosis or edema. Moving extremities with full ROM Abdomen:  Soft, nontender, nondistended. Neuro:  GCS 15, moving all four extremities, interacting appropriately. Speech clear. Psych:  Calm, appropriate.   Skin:  Warm, dry, no rash.    ED Results / Procedures / Treatments   Labs (all labs ordered are listed, but only abnormal results are displayed) Labs Reviewed  BASIC METABOLIC PANEL - Abnormal; Notable for the following components:      Result Value   Glucose, Bld 104 (*)    All other components within normal limits  CBC - Abnormal; Notable for the following components:   WBC 11.8 (*)    All other components within normal limits  TROPONIN I (HIGH SENSITIVITY)     EKG My EKG interpretation: Rate of 92, normal sinus rhythm, normal axis, normal intervals.  No acute ST elevations or depressions   RADIOLOGY Independently  interpreted chest x-ray and CTA chest with no acute pathology   PROCEDURES:  Critical Care performed: No  Procedures   MEDICATIONS ORDERED IN ED: Medications  iohexol  (OMNIPAQUE ) 350 MG/ML injection 75 mL (75 mLs Intravenous Contrast Given 09/01/23 1823)     IMPRESSION / MDM / ASSESSMENT AND PLAN / ED COURSE  I reviewed the triage vital signs and the nursing notes.                              Differential diagnosis includes, but is not limited to, pneumonia, pneumothorax, ACS, PE, viral URI, bronchitis  Patient's presentation is most consistent with acute complicated illness / injury requiring diagnostic workup.  Patient is an 89 year old female presenting today for cough and congestion which is acute on chronic.  She was reportedly hypoxic at her PCP office, but while here oxygen saturations are consistently greater than 94%.  She had no tachypnea  or other respiratory distress.  No abnormal lung sounds.  Vital signs otherwise stable.  EKG reassuring.  Troponin negative.  CBC and BMP unremarkable.  Chest x-ray and CTA chest showed no acute pulmonary pathology.  I do not think she was truly hypoxic at her PCP office and feel she is stable for discharge at this time.  Given the acute on chronic nature of her cough, will start her on a Z-Pak for potential treatment.  Told to follow-up with PCP as needed and given strict return precautions.  The patient is on the cardiac monitor to evaluate for evidence of arrhythmia and/or significant heart rate changes.     FINAL CLINICAL IMPRESSION(S) / ED DIAGNOSES   Final diagnoses:  Subacute cough  Congestion of nasal sinus     Rx / DC Orders   ED Discharge Orders          Ordered    azithromycin  (ZITHROMAX  Z-PAK) 250 MG tablet        09/01/23 1853             Note:  This document was prepared using Dragon voice recognition software and may include unintentional dictation errors.   Malvina Alm DASEN, MD 09/01/23 2022

## 2023-09-01 NOTE — Discharge Instructions (Signed)
 Please pick up your antibiotic and take as prescribed.  Please follow-up with your PCP as needed.  Please return for any severe worsening shortness of breath

## 2023-09-01 NOTE — ED Triage Notes (Signed)
 Pt to ED via POV from PCP. Pt reports has been feeling bad and was checked yesterday for COVID and RSV and was negative. Pt was in office today and oxygen was 85% on RA. Pt also reports was told she has PNA. Sats 91-93% in triage

## 2024-04-05 ENCOUNTER — Ambulatory Visit: Payer: Medicare Other | Admitting: Dermatology

## 2024-04-18 ENCOUNTER — Ambulatory Visit: Payer: Medicare Other | Admitting: Dermatology

## 2024-04-18 ENCOUNTER — Encounter: Payer: Self-pay | Admitting: Dermatology

## 2024-04-18 DIAGNOSIS — L578 Other skin changes due to chronic exposure to nonionizing radiation: Secondary | ICD-10-CM | POA: Diagnosis not present

## 2024-04-18 DIAGNOSIS — L821 Other seborrheic keratosis: Secondary | ICD-10-CM

## 2024-04-18 DIAGNOSIS — Z8589 Personal history of malignant neoplasm of other organs and systems: Secondary | ICD-10-CM

## 2024-04-18 DIAGNOSIS — D229 Melanocytic nevi, unspecified: Secondary | ICD-10-CM

## 2024-04-18 DIAGNOSIS — W908XXA Exposure to other nonionizing radiation, initial encounter: Secondary | ICD-10-CM

## 2024-04-18 DIAGNOSIS — H539 Unspecified visual disturbance: Secondary | ICD-10-CM | POA: Diagnosis not present

## 2024-04-18 DIAGNOSIS — Z85828 Personal history of other malignant neoplasm of skin: Secondary | ICD-10-CM

## 2024-04-18 DIAGNOSIS — D492 Neoplasm of unspecified behavior of bone, soft tissue, and skin: Secondary | ICD-10-CM | POA: Diagnosis not present

## 2024-04-18 DIAGNOSIS — L814 Other melanin hyperpigmentation: Secondary | ICD-10-CM

## 2024-04-18 DIAGNOSIS — Z86007 Personal history of in-situ neoplasm of skin: Secondary | ICD-10-CM

## 2024-04-18 DIAGNOSIS — L820 Inflamed seborrheic keratosis: Secondary | ICD-10-CM

## 2024-04-18 DIAGNOSIS — Q828 Other specified congenital malformations of skin: Secondary | ICD-10-CM | POA: Diagnosis not present

## 2024-04-18 DIAGNOSIS — Z1283 Encounter for screening for malignant neoplasm of skin: Secondary | ICD-10-CM | POA: Diagnosis not present

## 2024-04-18 DIAGNOSIS — L570 Actinic keratosis: Secondary | ICD-10-CM | POA: Diagnosis not present

## 2024-04-18 NOTE — Patient Instructions (Addendum)

## 2024-04-18 NOTE — Progress Notes (Signed)
 Follow-Up Visit   Subjective  Lori Ferguson is a 89 y.o. female who presents for the following: Skin Cancer Screening and Full Body Skin Exam hx of BCC, SCC, SCC IS, Aks, check scaly growths R med canthus, R cheek, R ear, all irritating, patient would like referral to ophthalmology due to vision changes and itching in eye  The patient presents for Total-Body Skin Exam (TBSE) for skin cancer screening and mole check. The patient has spots, moles and lesions to be evaluated, some may be new or changing and the patient may have concern these could be cancer.  The following portions of the chart were reviewed this encounter and updated as appropriate: medications, allergies, medical history  Review of Systems:  No other skin or systemic complaints except as noted in HPI or Assessment and Plan.  Objective  Well appearing patient in no apparent distress; mood and affect are within normal limits.  A full examination was performed including scalp, head, eyes, ears, nose, lips, neck, chest, axillae, abdomen, back, buttocks, bilateral upper extremities, bilateral lower extremities, hands, feet, fingers, toes, fingernails, and toenails. All findings within normal limits unless otherwise noted below.   Relevant physical exam findings are noted in the Assessment and Plan.  R ear inf antihelix, R ear, face x 8 (8) Pink scaly macules R lat neck x 1 Stuck on waxy paps with erythema L forearm Pink rough patch 0.7cm  R med canthus eyelid margin x 1 Stuck on waxy paps with erythema   Assessment & Plan   SKIN CANCER SCREENING PERFORMED TODAY.  ACTINIC DAMAGE - Chronic condition, secondary to cumulative UV/sun exposure - diffuse scaly erythematous macules with underlying dyspigmentation - Recommend daily broad spectrum sunscreen SPF 30+ to sun-exposed areas, reapply every 2 hours as needed.  - Staying in the shade or wearing long sleeves, sun glasses (UVA+UVB protection) and wide brim hats  (4-inch brim around the entire circumference of the hat) are also recommended for sun protection.  - Call for new or changing lesions.  LENTIGINES, SEBORRHEIC KERATOSES, HEMANGIOMAS - Benign normal skin lesions - Benign-appearing - Call for any changes  MELANOCYTIC NEVI - Tan-brown and/or pink-flesh-colored symmetric macules and papules - Benign appearing on exam today - Observation - Call clinic for new or changing moles - Recommend daily use of broad spectrum spf 30+ sunscreen to sun-exposed areas.   HISTORY OF BASAL CELL CARCINOMA OF THE SKIN - No evidence of recurrence today - Recommend regular full body skin exams - Recommend daily broad spectrum sunscreen SPF 30+ to sun-exposed areas, reapply every 2 hours as needed.  - Call if any new or changing lesions are noted between office visits  - Nasal tip  HISTORY OF SQUAMOUS CELL CARCINOMA OF THE SKIN - No evidence of recurrence today - No lymphadenopathy - Recommend regular full body skin exams - Recommend daily broad spectrum sunscreen SPF 30+ to sun-exposed areas, reapply every 2 hours as needed.  - Call if any new or changing lesions are noted between office visits - R med cheek, L tricep, R index finger  HISTORY OF SQUAMOUS CELL CARCINOMA IN SITU OF THE SKIN - No evidence of recurrence today - Recommend regular full body skin exams - Recommend daily broad spectrum sunscreen SPF 30+ to sun-exposed areas, reapply every 2 hours as needed.  - Call if any new or changing lesions are noted between office visits  - R med cheek   VISION CHANGES R eye Exam:  Treatment Plan: Patient would like  referral to Ophthalmology Will refer to [facility].  AK (ACTINIC KERATOSIS) (8) R ear inf antihelix, R ear, face x 8 (8) With [facility] of the R ear inferior antihelix   Actinic keratoses are precancerous spots that appear secondary to cumulative UV radiation exposure/sun exposure over time. They are chronic with expected duration  over 1 year. A portion of actinic keratoses will progress to squamous cell carcinoma of the skin. It is not possible to reliably predict which spots will progress to skin cancer and so treatment is recommended to prevent development of skin cancer.  Recommend daily broad spectrum sunscreen SPF 30+ to sun-exposed areas, reapply every 2 hours as needed.  Recommend staying in the shade or wearing long sleeves, sun glasses (UVA+UVB protection) and wide brim hats (4-inch brim around the entire circumference of the hat). Call for new or changing lesions. Destruction of lesion - R ear inf antihelix, R ear, face x 8 (8) Complexity: simple   Destruction method: cryotherapy   Informed consent: discussed and consent obtained   Timeout:  patient name, date of birth, surgical site, and procedure verified Lesion destroyed using liquid nitrogen: Yes   Region frozen until ice ball extended beyond lesion: Yes   Outcome: patient tolerated procedure well with no complications   Post-procedure details: wound care instructions given    INFLAMED SEBORRHEIC KERATOSIS R lat neck x 1 Symptomatic, irritating, patient would like treated. Destruction of lesion - R lat neck x 1 Complexity: simple   Destruction method: cryotherapy   Informed consent: discussed and consent obtained   Timeout:  patient name, date of birth, surgical site, and procedure verified Lesion destroyed using liquid nitrogen: Yes   Region frozen until ice ball extended beyond lesion: Yes   Outcome: patient tolerated procedure well with no complications   Post-procedure details: wound care instructions given    NEOPLASM OF SKIN L forearm Epidermal / dermal shaving  Lesion diameter (cm):  0.7 Informed consent: discussed and consent obtained   Timeout: patient name, date of birth, surgical site, and procedure verified   Procedure prep:  Patient was prepped and draped in usual sterile fashion Prep type:  Isopropyl alcohol Anesthesia: the  lesion was anesthetized in a standard fashion   Anesthetic:  1% lidocaine w/ epinephrine  1-100,000 buffered w/ 8.4% NaHCO3 Instrument used: flexible razor blade   Hemostasis achieved with: pressure, aluminum chloride and electrodesiccation   Outcome: patient tolerated procedure well   Post-procedure details: sterile dressing applied and wound care instructions given   Dressing type: bandage (mupirocin)    Specimen 1 - Surgical pathology Differential Diagnosis: R/O AK vs ISK vs DSAP   Check Margins: yes Pink rough patch 0.7cm SEBORRHEIC KERATOSIS, INFLAMED R med canthus eyelid margin x 1 Symptomatic, irritating, patient would like treated.  Destruction of lesion - R med canthus eyelid margin x 1 Complexity: simple   Destruction method: cryotherapy   Informed consent: discussed and consent obtained   Timeout:  patient name, date of birth, surgical site, and procedure verified Lesion destroyed using liquid nitrogen: Yes   Region frozen until ice ball extended beyond lesion: Yes   Outcome: patient tolerated procedure well with no complications   Post-procedure details: wound care instructions given    VISION CHANGES   Related Procedures Ambulatory referral to Ophthalmology  Return in about 5 months (around 09/18/2024) for Recheck ISK R medial canthus, AK f/u.  I, Grayce Saunas, RMA, am acting as scribe for Alm Rhyme, MD .   Documentation: I  have reviewed the above documentation for accuracy and completeness, and I agree with the above.  Alm Rhyme, MD

## 2024-04-19 LAB — SURGICAL PATHOLOGY

## 2024-04-24 ENCOUNTER — Ambulatory Visit: Payer: Self-pay | Admitting: Dermatology

## 2024-04-24 NOTE — Telephone Encounter (Addendum)
 Called and discussed results with patient. She verbalized understanding and denied further questions at this time. Discussed do not recommend further treatment at this time and will discuss condition with patient at next follow up.   ----- Message from Alm Rhyme sent at 04/24/2024  1:29 PM EDT ----- FINAL DIAGNOSIS        1. Skin, L forearm :       SUPERFICIAL ACTINIC POROKERATOSIS   Benign Superficial Actinic Porokeratosis Clinically most consistent with DSAP (Disseminated Superficial Actinic Porokeratosis) on arms. No treatment at this time Will discuss at next visit ----- Message ----- From: Interface, Lab In Three Zero One Sent: 04/19/2024   5:23 PM EDT To: Alm JAYSON Rhyme, MD

## 2024-09-19 ENCOUNTER — Ambulatory Visit: Admitting: Dermatology

## 2025-04-24 ENCOUNTER — Ambulatory Visit: Admitting: Dermatology
